# Patient Record
Sex: Female | Born: 1955
Health system: Southern US, Community
[De-identification: ages and names within clinical notes are randomized; demographics above are authoritative.]

## PROBLEM LIST (undated history)

## (undated) DIAGNOSIS — J45909 Unspecified asthma, uncomplicated: Secondary | ICD-10-CM

## (undated) DIAGNOSIS — K219 Gastro-esophageal reflux disease without esophagitis: Secondary | ICD-10-CM

## (undated) HISTORY — PX: KNEE SURGERY: SHX244

## (undated) HISTORY — PX: ABDOMINAL HYSTERECTOMY: SHX81

## (undated) HISTORY — PX: OTHER SURGICAL HISTORY: SHX169

## (undated) HISTORY — PX: WRIST ARTHROSCOPY WITH CARPOMETACARPEL (CMC) ARTHROPLASTY: SHX5680

## (undated) HISTORY — DX: Gastro-esophageal reflux disease without esophagitis: K21.9

---

## 1999-02-21 ENCOUNTER — Emergency Department (HOSPITAL_COMMUNITY): Admission: EM | Admit: 1999-02-21 | Discharge: 1999-02-21 | Payer: Self-pay | Admitting: Emergency Medicine

## 1999-02-21 ENCOUNTER — Encounter: Payer: Self-pay | Admitting: Emergency Medicine

## 1999-02-22 ENCOUNTER — Emergency Department (HOSPITAL_COMMUNITY): Admission: EM | Admit: 1999-02-22 | Discharge: 1999-02-22 | Payer: Self-pay | Admitting: Emergency Medicine

## 2000-06-22 ENCOUNTER — Ambulatory Visit (HOSPITAL_COMMUNITY): Admission: RE | Admit: 2000-06-22 | Discharge: 2000-06-22 | Payer: Self-pay | Admitting: Family Medicine

## 2000-06-22 ENCOUNTER — Encounter: Payer: Self-pay | Admitting: Family Medicine

## 2000-07-08 ENCOUNTER — Encounter: Admission: RE | Admit: 2000-07-08 | Discharge: 2000-07-08 | Payer: Self-pay | Admitting: Family Medicine

## 2000-07-08 ENCOUNTER — Encounter: Payer: Self-pay | Admitting: Family Medicine

## 2000-09-22 ENCOUNTER — Encounter (INDEPENDENT_AMBULATORY_CARE_PROVIDER_SITE_OTHER): Payer: Self-pay | Admitting: Specialist

## 2000-09-22 ENCOUNTER — Other Ambulatory Visit: Admission: RE | Admit: 2000-09-22 | Discharge: 2000-09-22 | Payer: Self-pay | Admitting: *Deleted

## 2001-08-05 ENCOUNTER — Encounter: Admission: RE | Admit: 2001-08-05 | Discharge: 2001-08-05 | Payer: Self-pay | Admitting: Family Medicine

## 2001-08-05 ENCOUNTER — Encounter: Payer: Self-pay | Admitting: Family Medicine

## 2001-08-08 ENCOUNTER — Encounter: Admission: RE | Admit: 2001-08-08 | Discharge: 2001-08-08 | Payer: Self-pay | Admitting: Family Medicine

## 2001-08-08 ENCOUNTER — Encounter: Payer: Self-pay | Admitting: Family Medicine

## 2002-10-11 ENCOUNTER — Encounter: Admission: RE | Admit: 2002-10-11 | Discharge: 2002-10-11 | Payer: Self-pay | Admitting: Family Medicine

## 2002-10-11 ENCOUNTER — Encounter: Payer: Self-pay | Admitting: Family Medicine

## 2004-01-08 ENCOUNTER — Other Ambulatory Visit: Admission: RE | Admit: 2004-01-08 | Discharge: 2004-01-08 | Payer: Self-pay | Admitting: Family Medicine

## 2004-01-14 ENCOUNTER — Encounter: Admission: RE | Admit: 2004-01-14 | Discharge: 2004-01-14 | Payer: Self-pay | Admitting: Family Medicine

## 2004-06-30 ENCOUNTER — Encounter: Admission: RE | Admit: 2004-06-30 | Discharge: 2004-06-30 | Payer: Self-pay | Admitting: Allergy and Immunology

## 2005-02-12 ENCOUNTER — Other Ambulatory Visit: Admission: RE | Admit: 2005-02-12 | Discharge: 2005-02-12 | Payer: Self-pay | Admitting: Family Medicine

## 2005-03-17 ENCOUNTER — Encounter: Admission: RE | Admit: 2005-03-17 | Discharge: 2005-03-17 | Payer: Self-pay | Admitting: Family Medicine

## 2006-03-25 ENCOUNTER — Other Ambulatory Visit: Admission: RE | Admit: 2006-03-25 | Discharge: 2006-03-25 | Payer: Self-pay | Admitting: Family Medicine

## 2006-06-04 ENCOUNTER — Encounter: Admission: RE | Admit: 2006-06-04 | Discharge: 2006-06-04 | Payer: Self-pay | Admitting: Family Medicine

## 2007-08-10 ENCOUNTER — Encounter: Admission: RE | Admit: 2007-08-10 | Discharge: 2007-08-10 | Payer: Self-pay | Admitting: Family Medicine

## 2008-08-29 ENCOUNTER — Encounter: Admission: RE | Admit: 2008-08-29 | Discharge: 2008-08-29 | Payer: Self-pay | Admitting: Hematology and Oncology

## 2009-09-25 ENCOUNTER — Encounter: Admission: RE | Admit: 2009-09-25 | Discharge: 2009-09-25 | Payer: Self-pay | Admitting: Hematology and Oncology

## 2010-01-03 ENCOUNTER — Encounter (INDEPENDENT_AMBULATORY_CARE_PROVIDER_SITE_OTHER): Payer: Self-pay | Admitting: *Deleted

## 2010-02-05 ENCOUNTER — Encounter (INDEPENDENT_AMBULATORY_CARE_PROVIDER_SITE_OTHER): Payer: Self-pay | Admitting: *Deleted

## 2010-02-06 ENCOUNTER — Ambulatory Visit: Payer: Self-pay | Admitting: Internal Medicine

## 2010-02-19 ENCOUNTER — Ambulatory Visit: Payer: Self-pay | Admitting: Internal Medicine

## 2010-02-21 ENCOUNTER — Encounter: Payer: Self-pay | Admitting: Internal Medicine

## 2010-10-13 ENCOUNTER — Encounter
Admission: RE | Admit: 2010-10-13 | Discharge: 2010-10-13 | Payer: Self-pay | Source: Home / Self Care | Attending: Obstetrics and Gynecology | Admitting: Obstetrics and Gynecology

## 2010-11-29 ENCOUNTER — Encounter: Payer: Self-pay | Admitting: Family Medicine

## 2010-12-01 ENCOUNTER — Encounter: Payer: Self-pay | Admitting: Family Medicine

## 2010-12-11 NOTE — Letter (Signed)
Summary: Patient Notice- Polyp Results  [facility]  [address] [place], [place] [address]   Phone: [phone]  Fax: [phone]        February 21, 2010 MRN: [id number]    CHANLEY MCENERY [address] [place], [place]  [address]    Dear Ms. Nancy Nordmann,  I am pleased to inform you that the colon polyp(s) removed during your recent colonoscopy was (were) found to be benign (no cancer detected) upon pathologic examination.  I recommend you have a repeat colonoscopy examination in 5 years to look for recurrent polyps, as having colon polyps increases your risk for having recurrent polyps or even colon cancer in the future.  Should you develop new or worsening symptoms of abdominal pain, bowel habit changes or bleeding from the rectum or bowels, please schedule an evaluation with either your primary care physician or with me.  Additional information/recommendations:  __ No further action with gastroenterology is needed at this time. Please      follow-up with your primary care physician for your other healthcare      needs.   Please call us if you are having persistent problems or have questions about your condition that have not been fully answered at this time.  Sincerely,  Hilarie Fredrickson MD  This letter has been electronically signed by your physician.  Appended Document: Patient Notice- Polyp Results letter mailed 4.18.11

## 2010-12-11 NOTE — Letter (Signed)
Summary: [facility] Instructions  [facility]  [address] [place], [place] [address]   Phone: [phone]  Fax: [phone]       Whitney Wallace    09/29/1956    MRN: [id number]        Procedure Day Dorna Bloom:  Whitney Wallace 02/19/2010     Arrival Time: 10:00 am      Procedure Time: 11:00 am     Location of Procedure:                    _x _  [facility] (4th Floor)                        PREPARATION FOR COLONOSCOPY WITH MOVIPREP   Starting 5 days prior to your procedure Friday 4/8 do not eat nuts, seeds, popcorn, corn, beans, peas,  salads, or any raw vegetables.  Do not take any fiber supplements (e.g. Metamucil, Citrucel, and Benefiber).  THE DAY BEFORE YOUR PROCEDURE         DATE: Tuesday 4/12  1.  Drink clear liquids the entire day-NO SOLID FOOD  2.  Do not drink anything colored red or purple.  Avoid juices with pulp.  No orange juice.  3.  Drink at least 64 oz. (8 glasses) of fluid/clear liquids during the day to prevent dehydration and help the prep work efficiently.  CLEAR LIQUIDS INCLUDE: Water Jello Ice Popsicles Tea (sugar ok, no milk/cream) Powdered fruit flavored drinks Coffee (sugar ok, no milk/cream) Gatorade Juice: apple, white grape, white cranberry  Lemonade Clear bullion, consomm, broth Carbonated beverages (any kind) Strained chicken noodle soup Hard Candy                             4.  In the morning, mix first dose of MoviPrep solution:    Empty 1 Pouch A and 1 Pouch B into the disposable container    Add lukewarm drinking water to the top line of the container. Mix to dissolve    Refrigerate (mixed solution should be used within 24 hrs)  5.  Begin drinking the prep at 5:00 p.m. The MoviPrep container is divided by 4 marks.   Every 15 minutes drink the solution down to the next mark (approximately 8 oz) until the full liter is complete.   6.  Follow completed prep with 16 oz of clear liquid of your choice  (Nothing red or purple).  Continue to drink clear liquids until bedtime.  7.  Before going to bed, mix second dose of MoviPrep solution:    Empty 1 Pouch A and 1 Pouch B into the disposable container    Add lukewarm drinking water to the top line of the container. Mix to dissolve    Refrigerate  THE DAY OF YOUR PROCEDURE      DATE:  Wednesday 4/13  Beginning at 6:00 a.m. (5 hours before procedure):         1. Every 15 minutes, drink the solution down to the next mark (approx 8 oz) until the full liter is complete.  2. Follow completed prep with 16 oz. of clear liquid of your choice.    3. You may drink clear liquids until 9:00 am  (2 HOURS BEFORE PROCEDURE).   MEDICATION INSTRUCTIONS  Unless otherwise instructed, you should take regular prescription medications with a small sip of water   as early as possible the  morning of your procedure.        OTHER INSTRUCTIONS  You will need a responsible adult at least 53 years of age to accompany you and drive you home.   This person must remain in the waiting room during your procedure.  Wear loose fitting clothing that is easily removed.  Leave jewelry and other valuables at home.  However, you may wish to bring a book to read or  an iPod/MP3 player to listen to music as you wait for your procedure to start.  Remove all body piercing jewelry and leave at home.  Total time from sign-in until discharge is approximately 2-3 hours.  You should go home directly after your procedure and rest.  You can resume normal activities the  day after your procedure.  The day of your procedure you should not:   Drive   Make legal decisions   Operate machinery   Drink alcohol   Return to work  You will receive specific instructions about eating, activities and medications before you leave.    The above instructions have been reviewed and explained to me by   Karl Bales RN  February 06, 2010 2:46 PM     I fully understand  and can verbalize these instructions _____________________________ Date _________

## 2010-12-11 NOTE — Letter (Signed)
Summary: Previsit letter  [facility]  [address] [place], [place] [address]   Phone: [phone]  Fax: [phone]       01/03/2010 MRN: [id number]  Whitney Wallace [address] [place], [place]  [address]  Dear Ms. Nancy Nordmann,  Welcome to the Gastroenterology Division at [facility].    You are scheduled to see a nurse for your pre-procedure visit on 02-06-10 at 2:00p.m. on the 3rd floor at [facility], [address]  We ask that you try to arrive at our office 15 minutes prior to your appointment time to allow for check-in.  Your nurse visit will consist of discussing your medical and surgical history, your immediate family medical history, and your medications.    Please bring a complete list of all your medications or, if you prefer, bring the medication bottles and we will list them.  We will need to be aware of both prescribed and over the counter drugs.  We will need to know exact dosage information as well.  If you are on blood thinners (Coumadin, Plavix, Aggrenox, Ticlid, etc.) please call our office today/prior to your appointment, as we need to consult with your physician about holding your medication.   Please be prepared to read and sign documents such as consent forms, a financial agreement, and acknowledgement forms.  If necessary, and with your consent, a friend or relative is welcome to sit-in on the nurse visit with you.  Please bring your insurance card so that we may make a copy of it.  If your insurance requires a referral to see a specialist, please bring your referral form from your primary care physician.  No co-pay is required for this nurse visit.     If you cannot keep your appointment, please call [phone] to cancel or reschedule prior to your appointment date.  This allows [place] the opportunity to schedule an appointment for another patient in need of care.    Thank you for choosing [facility] for your medical  needs.  We appreciate the opportunity to care for you.  Please visit [place] at our website  to learn more about our practice.                     Sincerely.                                                                                                                   The Gastroenterology Division

## 2010-12-11 NOTE — Procedures (Signed)
Summary: Colonoscopy  Patient: [name] Note: All result statuses are Final unless otherwise noted.  Tests: (1) Colonoscopy (COL)   COL Colonoscopy           DONE      [facility]     [address]     [place], [place]  [address]           COLONOSCOPY PROCEDURE REPORT           PATIENT:  Whitney Wallace, Whitney Wallace  MR#:  [id number]     BIRTHDATE:  [birth year], 54 yrs. old  GENDER:  female     ENDOSCOPIST:  Wilhemina Bonito. Eda Keys, MD     REF. BY:  .Direct Self,     PROCEDURE DATE:  02/19/2010     PROCEDURE:  Colonoscopy with snare polypectomy     ASA CLASS:  Class III     INDICATIONS:  Elevated Risk Screening ; FATHER W/ COLON CANCER     AROUND 65; PT W/ PERSONAL HX UTERINE CA S/P SURGERY / CHEMO/XRT     MEDICATIONS:   Fentanyl 100 mcg IV, Versed 13 mg IV, Benadryl 50     mg IV           DESCRIPTION OF PROCEDURE:   After the risks benefits and     alternatives of the procedure were thoroughly explained, informed     consent was obtained.  Digital rectal exam was performed and     revealed no abnormalities.   The LB CF-[id number] endoscope     was introduced through the anus and advanced to the cecum, which     was identified by both the appendix and ileocecal valve, without     limitations.TIME TO CECUM =2:42 MIN. The quality of the prep was     excellent, using MoviPrep.  The instrument was then slowly     withdrawn (TIME = 13:03 MIN) as the colon was fully examined.     <<PROCEDUREIMAGES>>           FINDINGS:  A 6mm pedunculated polyp was found at the hepatic     flexure. Polyp was snared without cautery. Retrieval was     successful.  Mild diverticulosis was found in the sigmoid colon.     Melanosis coli  was found.    Unable to retroflex.    The scope was     then withdrawn from the patient and the procedure completed.           COMPLICATIONS:  None     ENDOSCOPIC IMPRESSION:     1) Pedunculated polyp at the hepatic flexure -removed     2) Mild diverticulosis in the  sigmoid colon     3) Melanosis           RECOMMENDATIONS:     1) Follow up colonoscopy in 5 years           ______________________________     Wilhemina Bonito. Eda Keys, MD           CC:  The Patient           n.     eSIGNED:   Wilhemina Bonito. Eda Keys at 02/19/2010 12:07 PM           Marene Lenz, [id number]  Note: An exclamation mark (!) indicates a result that was not dispersed into the flowsheet. Document Creation Date: 02/19/2010 12:07 PM _______________________________________________________________________  (1) Order result status: Final Collection or observation date-time: 02/19/2010 12:00  Requested date-time:  Receipt date-time:  Reported date-time:  Referring Physician:   Ordering Physician: Fransico Setters [phone]) Specimen Source:  Source: Launa Grill Order Number: [id number] Lab site:   Appended Document: Colonoscopy     Procedures Next Due Date:    Colonoscopy: 02/2015

## 2010-12-11 NOTE — Miscellaneous (Signed)
Summary: [facility] previsit  Clinical Lists Changes  Medications: Added new medication of MOVIPREP 100 GM  SOLR (PEG-KCL-NACL-NASULF-NA ASC-C) As per prep instructions. - Signed Rx of MOVIPREP 100 GM  SOLR (PEG-KCL-NACL-NASULF-NA ASC-C) As per prep instructions.;  #1 x 0;  Signed;  Entered by: Karl Bales RN;  Authorized by: Hilarie Fredrickson MD;  Method used: Electronically to [facility] [address] [place], [place], [place]  [address], Ph: [phone], Fax: [phone] Allergies: Added new allergy or adverse reaction of FLEXERIL Added new allergy or adverse reaction of LEVAQUIN Observations: Added new observation of NKA: F (02/06/2010 13:46)    Prescriptions: MOVIPREP 100 GM  SOLR (PEG-KCL-NACL-NASULF-NA ASC-C) As per prep instructions.  #1 x 0   Entered by:   Karl Bales RN   Authorized by:   Hilarie Fredrickson MD   Signed by:   Karl Bales RN on 02/06/2010   Method used:   Electronically to        [facility] (retail)       [address]       [place], [place]  [address]       Ph: [phone]       Fax: [phone]   RxID:   [id number]

## 2011-05-26 ENCOUNTER — Telehealth: Payer: Self-pay | Admitting: Gastroenterology

## 2011-05-26 ENCOUNTER — Telehealth: Payer: Self-pay | Admitting: Internal Medicine

## 2011-05-26 NOTE — Telephone Encounter (Signed)
Previous note had wrong MD on it.

## 2011-05-26 NOTE — Telephone Encounter (Signed)
Transferring GI Care

## 2011-12-18 ENCOUNTER — Other Ambulatory Visit: Payer: Self-pay | Admitting: Family Medicine

## 2011-12-18 DIAGNOSIS — Z1231 Encounter for screening mammogram for malignant neoplasm of breast: Secondary | ICD-10-CM

## 2012-01-06 ENCOUNTER — Ambulatory Visit
Admission: RE | Admit: 2012-01-06 | Discharge: 2012-01-06 | Disposition: A | Discharge status: Home / Self Care | Payer: Self-pay | Source: Ambulatory Visit | Attending: Family Medicine | Admitting: Family Medicine

## 2012-01-06 DIAGNOSIS — Z1231 Encounter for screening mammogram for malignant neoplasm of breast: Secondary | ICD-10-CM

## 2012-12-15 ENCOUNTER — Other Ambulatory Visit: Payer: Self-pay | Admitting: Family Medicine

## 2012-12-15 DIAGNOSIS — Z1231 Encounter for screening mammogram for malignant neoplasm of breast: Secondary | ICD-10-CM

## 2013-01-12 ENCOUNTER — Ambulatory Visit
Admission: RE | Admit: 2013-01-12 | Discharge: 2013-01-12 | Disposition: A | Discharge status: Home / Self Care | Payer: Medicare Other | Source: Ambulatory Visit | Attending: Family Medicine | Admitting: Family Medicine

## 2013-01-12 ENCOUNTER — Ambulatory Visit: Payer: Self-pay

## 2014-01-05 ENCOUNTER — Emergency Department (HOSPITAL_COMMUNITY): Payer: Medicare Other

## 2014-01-05 ENCOUNTER — Emergency Department (HOSPITAL_COMMUNITY)
Admission: EM | Admit: 2014-01-05 | Discharge: 2014-01-05 | Disposition: A | Discharge status: Nursing Home (Includes ICF) | Payer: Medicare Other | Source: Home / Self Care | Attending: Emergency Medicine | Admitting: Emergency Medicine

## 2014-01-05 ENCOUNTER — Encounter (HOSPITAL_COMMUNITY): Payer: Self-pay | Admitting: Emergency Medicine

## 2014-01-05 ENCOUNTER — Emergency Department (HOSPITAL_COMMUNITY)
Admission: EM | Admit: 2014-01-05 | Discharge: 2014-01-05 | Disposition: A | Discharge status: Home / Self Care | Payer: Medicare Other | Attending: Emergency Medicine | Admitting: Emergency Medicine

## 2014-01-05 DIAGNOSIS — Z792 Long term (current) use of antibiotics: Secondary | ICD-10-CM | POA: Insufficient documentation

## 2014-01-05 DIAGNOSIS — Z8544 Personal history of malignant neoplasm of other female genital organs: Secondary | ICD-10-CM | POA: Insufficient documentation

## 2014-01-05 DIAGNOSIS — R55 Syncope and collapse: Secondary | ICD-10-CM

## 2014-01-05 DIAGNOSIS — Y9289 Other specified places as the place of occurrence of the external cause: Secondary | ICD-10-CM | POA: Insufficient documentation

## 2014-01-05 DIAGNOSIS — S0083XA Contusion of other part of head, initial encounter: Secondary | ICD-10-CM

## 2014-01-05 DIAGNOSIS — S1093XA Contusion of unspecified part of neck, initial encounter: Secondary | ICD-10-CM

## 2014-01-05 DIAGNOSIS — S0993XA Unspecified injury of face, initial encounter: Secondary | ICD-10-CM

## 2014-01-05 DIAGNOSIS — K219 Gastro-esophageal reflux disease without esophagitis: Secondary | ICD-10-CM | POA: Insufficient documentation

## 2014-01-05 DIAGNOSIS — Y9301 Activity, walking, marching and hiking: Secondary | ICD-10-CM | POA: Insufficient documentation

## 2014-01-05 DIAGNOSIS — S199XXA Unspecified injury of neck, initial encounter: Secondary | ICD-10-CM

## 2014-01-05 DIAGNOSIS — Z79899 Other long term (current) drug therapy: Secondary | ICD-10-CM | POA: Insufficient documentation

## 2014-01-05 DIAGNOSIS — R296 Repeated falls: Secondary | ICD-10-CM | POA: Insufficient documentation

## 2014-01-05 DIAGNOSIS — S0003XA Contusion of scalp, initial encounter: Secondary | ICD-10-CM | POA: Insufficient documentation

## 2014-01-05 DIAGNOSIS — IMO0002 Reserved for concepts with insufficient information to code with codable children: Secondary | ICD-10-CM | POA: Insufficient documentation

## 2014-01-05 DIAGNOSIS — J45909 Unspecified asthma, uncomplicated: Secondary | ICD-10-CM | POA: Insufficient documentation

## 2014-01-05 HISTORY — DX: Unspecified asthma, uncomplicated: J45.909

## 2014-01-05 HISTORY — DX: Gastro-esophageal reflux disease without esophagitis: K21.9

## 2014-01-05 LAB — CBC
HEMATOCRIT: 37.8 % (ref 36.0–46.0)
HEMOGLOBIN: 12.4 g/dL (ref 12.0–15.0)
MCH: 31.4 pg (ref 26.0–34.0)
MCHC: 32.8 g/dL (ref 30.0–36.0)
MCV: 95.7 fL (ref 78.0–100.0)
Platelets: 409 10*3/uL — ABNORMAL HIGH (ref 150–400)
RBC: 3.95 MIL/uL (ref 3.87–5.11)
RDW: 15.1 % (ref 11.5–15.5)
WBC: 7.2 10*3/uL (ref 4.0–10.5)

## 2014-01-05 LAB — I-STAT TROPONIN, ED: TROPONIN I, POC: 0.01 ng/mL (ref 0.00–0.08)

## 2014-01-05 LAB — BASIC METABOLIC PANEL
BUN: 9 mg/dL (ref 6–23)
CHLORIDE: 103 meq/L (ref 96–112)
CO2: 26 meq/L (ref 19–32)
Calcium: 9.7 mg/dL (ref 8.4–10.5)
Creatinine, Ser: 0.9 mg/dL (ref 0.50–1.10)
GFR calc non Af Amer: 70 mL/min — ABNORMAL LOW (ref >90)
GFR, EST AFRICAN AMERICAN: 81 mL/min — AB (ref >90)
Glucose, Bld: 96 mg/dL (ref 70–99)
POTASSIUM: 4 meq/L (ref 3.7–5.3)
SODIUM: 142 meq/L (ref 137–147)

## 2014-01-05 LAB — I-STAT CHEM 8, ED
BUN: 8 mg/dL (ref 6–23)
CALCIUM ION: 1.26 mmol/L — AB (ref 1.12–1.23)
CREATININE: 1 mg/dL (ref 0.50–1.10)
Chloride: 102 mEq/L (ref 96–112)
Glucose, Bld: 97 mg/dL (ref 70–99)
HEMATOCRIT: 40 % (ref 36.0–46.0)
HEMOGLOBIN: 13.6 g/dL (ref 12.0–15.0)
Potassium: 3.9 mEq/L (ref 3.7–5.3)
SODIUM: 142 meq/L (ref 137–147)
TCO2: 27 mmol/L (ref 0–100)

## 2014-01-05 LAB — CBG MONITORING, ED: GLUCOSE-CAPILLARY: 93 mg/dL (ref 70–99)

## 2014-01-05 MED ORDER — HYDROCODONE-ACETAMINOPHEN 5-325 MG PO TABS: 2.0000 | ORAL_TABLET | ORAL | Status: DC | PRN

## 2014-01-05 MED ORDER — FLUTICASONE PROPIONATE 50 MCG/ACT NA SUSP: NASAL | Status: DC

## 2014-01-05 MED ORDER — ONDANSETRON HCL 4 MG/2ML IJ SOLN
4.0000 mg | Freq: Once | INTRAMUSCULAR | Status: AC
  Administered 2014-01-05: 4 mg via INTRAVENOUS
  Filled 2014-01-05: qty 2

## 2014-01-05 MED ORDER — MORPHINE SULFATE 4 MG/ML IJ SOLN
4.0000 mg | INTRAMUSCULAR | Status: DC | PRN
  Administered 2014-01-05: 4 mg via INTRAVENOUS
  Filled 2014-01-05: qty 1

## 2014-01-05 NOTE — ED Notes (Addendum)
Presents with Syncopal episode occurred at 06:15 this AM. Pt reports, "I just woke up and was walking down a hallway and decided I needed to stretch. I raised my hands up over my head and began to stretch and the next thing I knew I woke up on the floor " denies pain, denies dizziness. Hematoma to right side of face. Reports some mild dizzy spells at times but nothing right before this happened. Alert, and oreinted. Answering all questions appropriately. Reports facial pain and jaw pain denies vision difficulty from right eye. dneis chest pain, nausea, weakness.  Endorses neck pain. c-collar applied

## 2014-01-05 NOTE — Discharge Instructions (Signed)
Facial or Scalp Contusion A facial or scalp contusion is a deep bruise on the face or head. Injuries to the face and head generally cause a lot of swelling, especially around the eyes. Contusions are the result of an injury that caused bleeding under the skin. The contusion may turn blue, purple, or yellow. Minor injuries will give you a painless contusion, but more severe contusions may stay painful and swollen for a few weeks.  CAUSES  A facial or scalp contusion is caused by a blunt injury or trauma to the face or head area.  SIGNS AND SYMPTOMS   Swelling of the injured area.   Discoloration of the injured area.   Tenderness, soreness, or pain in the injured area.  DIAGNOSIS  The diagnosis can be made by taking a medical history and doing a physical exam. An X-ray exam, CT scan, or MRI may be needed to determine if there are any associated injuries, such as broken bones (fractures). TREATMENT  Often, the best treatment for a facial or scalp contusion is applying cold compresses to the injured area. Over-the-counter medicines may also be recommended for pain control.  HOME CARE INSTRUCTIONS   Only take over-the-counter or prescription medicines as directed by your health care provider.   Apply ice to the injured area.   Put ice in a plastic bag.   Place a towel between your skin and the bag.   Leave the ice on for 20 minutes, 2 3 times a day.  SEEK MEDICAL CARE IF:  You have bite problems.   You have pain with chewing.   You are concerned about facial defects. SEEK IMMEDIATE MEDICAL CARE IF:  You have severe pain or a headache that is not relieved by medicine.   You have unusual sleepiness, confusion, or personality changes.   You throw up (vomit).   You have a persistent nosebleed.   You have double vision or blurred vision.   You have fluid drainage from your nose or ear.   You have difficulty walking or using your arms or legs.  MAKE SURE YOU:    Understand these instructions.  Will watch your condition.  Will get help right away if you are not doing well or get worse. Document Released: 12/03/2004 Document Revised: 08/16/2013 Document Reviewed: 06/08/2013 [facility] Patient Information 2014 [place], [place].  Vasovagal Syncope, Adult Syncope, commonly known as fainting, is a temporary loss of consciousness. It occurs when the blood flow to the brain is reduced. Vasovagal syncope (also called neurocardiogenic syncope) is a fainting spell in which the blood flow to the brain is reduced because of a sudden drop in heart rate and blood pressure. Vasovagal syncope occurs when the brain and the cardiovascular system (blood vessels) do not adequately communicate and respond to each other. This is the most common cause of fainting. It often occurs in response to fear or some other type of emotional or physical stress. The body has a reaction in which the heart starts beating too slowly or the blood vessels expand, reducing blood pressure. This type of fainting spell is generally considered harmless. However, injuries can occur if a person takes a sudden fall during a fainting spell.  CAUSES  Vasovagal syncope occurs when a person's blood pressure and heart rate decrease suddenly, usually in response to a trigger. Many things and situations can trigger an episode. Some of these include:   Pain.   Fear.   The sight of blood or medical procedures, such as blood being  drawn from a vein.   Common activities, such as coughing, swallowing, stretching, or going to the bathroom.   Emotional stress.   Prolonged standing, especially in a warm environment.   Lack of sleep or rest.   Prolonged lack of food.   Prolonged lack of fluids.   Recent illness.  The use of certain drugs that affect blood pressure, such as cocaine, alcohol, marijuana, inhalants, and opiates.  SYMPTOMS  Before the fainting episode, you may:   Feel dizzy or  light headed.   Become pale.  Sense that you are going to faint.   Feel like the room is spinning.   Have tunnel vision, only seeing directly in front of you.   Feel sick to your stomach (nauseous).   See spots or slowly lose vision.   Hear ringing in your ears.   Have a headache.   Feel warm and sweaty.   Feel a sensation of pins and needles. During the fainting spell, you will generally be unconscious for no longer than a couple minutes before waking up and returning to normal. If you get up too quickly before your body can recover, you may faint again. Some twitching or jerky movements may occur during the fainting spell.  DIAGNOSIS  Your caregiver will ask about your symptoms, take a medical history, and perform a physical exam. Various tests may be done to rule out other causes of fainting. These may include blood tests and tests to check the heart, such as electrocardiography, echocardiography, and possibly an electrophysiology study. When other causes have been ruled out, a test may be done to check the body's response to changes in position (tilt table test). TREATMENT  Most cases of vasovagal syncope do not require treatment. Your caregiver may recommend ways to avoid fainting triggers and may provide home strategies for preventing fainting. If you must be exposed to a possible trigger, you can drink additional fluids to help reduce your chances of having an episode of vasovagal syncope. If you have warning signs of an oncoming episode, you can respond by positioning yourself favorably (lying down). If your fainting spells continue, you may be given medicines to prevent fainting. Some medicines may help make you more resistant to repeated episodes of vasovagal syncope. Special exercises or compression stockings may be recommended. In rare cases, the surgical placement of a pacemaker is considered. HOME CARE INSTRUCTIONS   Learn to identify the warning signs of vasovagal  syncope.   Sit or lie down at the first warning sign of a fainting spell. If sitting, put your head down between your legs. If you lie down, swing your legs up in the air to increase blood flow to the brain.   Avoid hot tubs and saunas.  Avoid prolonged standing.  Drink enough fluids to keep your urine clear or pale yellow. Avoid caffeine.  Increase salt in your diet as directed by your caregiver.   If you have to stand for a long time, perform movements such as:   Crossing your legs.   Flexing and stretching your leg muscles.   Squatting.   Moving your legs.   Bending over.   Only take over-the-counter or prescription medicines as directed by your caregiver. Do not suddenly stop any medicines without asking your caregiver first. SEEK MEDICAL CARE IF:   Your fainting spells continue or happen more frequently in spite of treatment.   You lose consciousness for more than a couple minutes.  You have fainting spells during or after  exercising or after being startled.   You have new symptoms that occur with the fainting spells, such as:   Shortness of breath.  Chest pain.   Irregular heartbeat.   You have episodes of twitching or jerky movements that last longer than a few seconds.  You have episodes of twitching or jerky movements without obvious fainting. SEEK IMMEDIATE MEDICAL CARE IF:   You have injuries or bleeding after a fainting spell.   You have episodes of twitching or jerky movements that last longer than 5 minutes.   You have more than one spell of twitching or jerky movements before returning to consciousness after fainting. MAKE SURE YOU:   Understand these instructions.  Will watch your condition.  Will get help right away if you are not doing well or get worse. Document Released: 10/12/2012 Document Reviewed: 10/12/2012 [facility] Patient Information 2014 [place], [place].

## 2014-01-05 NOTE — ED Provider Notes (Signed)
CSN: [id number]     Arrival date & time 01/05/14  1554 History   First MD Initiated Contact with Patient 01/05/14 1733     Chief Complaint  Patient presents with  . Loss of Consciousness      HPI  Patient presents after a syncopal episode. Seen initially at urgent care and referred here. Patient states he awakened early this morning. She stood up and started walking the hall she stretched arms over her head. She felt dizzy for one brief seconds and then had a fall the floor. She was not unconscious. She states she felt dizzy and took a few seconds to gather herself. She has pain and swelling around her right eye. Her husband heard the fall and he states one second later she called out to him. He went to her. She was awake. Complaining of pain around her eye. She was dizzy described as lightheaded but not vertigo. He took her to the restroom and back to bed. Pain around her eye progressed, so they went to urgent care and was referred here. She states she "always" gets dizzy when she stands up. She sits for a bit. She did not today. No chest pain. No palpitations. No fluid loss with vomiting or diarrhea.  Past Medical History  Diagnosis Date  . Asthma   . GERD (gastroesophageal reflux disease)    Past Surgical History  Procedure Laterality Date  . Uterine cancer    . Abdominal hysterectomy    . Knee surgery     History reviewed. No pertinent family history. History  Substance Use Topics  . Smoking status: Never Smoker   . Smokeless tobacco: Not on file  . Alcohol Use: Yes   OB History   Grav Para Term Preterm Abortions TAB SAB Ect Mult Living                 Review of Systems  Constitutional: Negative for fever, chills, diaphoresis, appetite change and fatigue.  HENT: Negative for mouth sores, sore throat and trouble swallowing.        Pain and swelling around the right eye. No vision changes or  Eyes: Negative for visual disturbance.  Respiratory: Negative for cough, chest  tightness, shortness of breath and wheezing.   Cardiovascular: Negative for chest pain.  Gastrointestinal: Negative for nausea, vomiting, abdominal pain, diarrhea and abdominal distention.  Endocrine: Negative for polydipsia, polyphagia and polyuria.  Genitourinary: Negative for dysuria, frequency and hematuria.  Musculoskeletal: Negative for gait problem.  Skin: Negative for color change, pallor and rash.  Neurological: Positive for syncope. Negative for dizziness, light-headedness and headaches.  Hematological: Does not bruise/bleed easily.  Psychiatric/Behavioral: Negative for behavioral problems and confusion.      Allergies  Cyclobenzaprine hcl  Home Medications   Current Outpatient Rx  Name  Route  Sig  Dispense  Refill  . acetaminophen (TYLENOL) 500 MG tablet   Oral   Take 1,000 mg by mouth every 6 (six) hours as needed for moderate pain.         Marland Kitchen albuterol (PROVENTIL HFA;VENTOLIN HFA) 108 (90 BASE) MCG/ACT inhaler   Inhalation   Inhale 2 puffs into the lungs every 6 (six) hours as needed for wheezing or shortness of breath.         . ALPRAZolam (XANAX) 1 MG tablet   Oral   Take 1 mg by mouth 2 (two) times daily.         . Ascorbic Acid (VITAMIN C) 1000 MG tablet  Oral   Take 1,000 mg by mouth 2 (two) times daily.         . budesonide-formoterol (SYMBICORT) 80-4.5 MCG/ACT inhaler   Inhalation   Inhale 2 puffs into the lungs 2 (two) times daily.         Marland Kitchen buPROPion (WELLBUTRIN XL) 150 MG 24 hr tablet   Oral   Take 150 mg by mouth daily.         Marland Kitchen CALCIUM CITRATE PO   Oral   Take 1 tablet by mouth 2 (two) times daily.         . Cholecalciferol (VITAMIN D PO)   Oral   Take 1 tablet by mouth daily.         Marland Kitchen dexlansoprazole (DEXILANT) 60 MG capsule   Oral   Take 60 mg by mouth daily.         . furosemide (LASIX) 20 MG tablet   Oral   Take 20 mg by mouth daily as needed for fluid or edema.         . gabapentin (NEURONTIN) 300 MG  capsule   Oral   Take 300 mg by mouth at bedtime.         Marland Kitchen levothyroxine (SYNTHROID, LEVOTHROID) 88 MCG tablet   Oral   Take 88 mcg by mouth daily before breakfast.         . MAGNESIUM PO   Oral   Take 1 tablet by mouth daily. Also contains vitamin K         . metronidazole (NORITATE) 1 % cream   Topical   Apply 1 application topically daily.         . montelukast (SINGULAIR) 10 MG tablet   Oral   Take 10 mg by mouth at bedtime.         . Multiple Vitamin (MULTIVITAMIN WITH MINERALS) TABS tablet   Oral   Take 1 tablet by mouth daily.         . Multiple Vitamins-Minerals (ZINC PO)   Oral   Take 1 tablet by mouth daily.         . potassium chloride (K-DUR,KLOR-CON) 10 MEQ tablet   Oral   Take 10 mEq by mouth daily.         . Probiotic Product (PROBIOTIC DAILY PO)   Oral   Take 1 tablet by mouth daily.          . sertraline (ZOLOFT) 100 MG tablet   Oral   Take 100 mg by mouth daily.         . Teriparatide, Recombinant, (FORTEO [place])   Subcutaneous   Inject 1 each into the skin at bedtime.         Marland Kitchen tetrahydrozoline 0.05 % ophthalmic solution   Both Eyes   Place 2 drops into both eyes daily as needed (for dry eyes).         . Triamcinolone Acetonide (NASACORT ALLERGY 24HR NA)   Nasal   Place 2 sprays into the nose at bedtime.          . vitamin C (ASCORBIC ACID) 500 MG tablet   Oral   Take 500 mg by mouth daily.         Marland Kitchen zolpidem (AMBIEN) 10 MG tablet   Oral   Take 10 mg by mouth at bedtime as needed for sleep.         . fluticasone (FLONASE) 50 MCG/ACT nasal spray      1 spray  each nares BID   10 g   1   . HYDROcodone-acetaminophen (NORCO/VICODIN) 5-325 MG per tablet   Oral   Take 2 tablets by mouth every 4 (four) hours as needed.   20 tablet   0    BP 133/66  Pulse 70  Temp(Src) 97.8 F (36.6 C) (Oral)  Resp 17  Ht 5\' 1"  (1.549 m)  Wt 150 lb (68.04 kg)  BMI 28.36 kg/m2  SpO2 97% Physical Exam   Constitutional: She is oriented to person, place, and time. She appears well-developed and well-nourished. No distress.  HENT:  Head: Normocephalic.  Circumferential right periorbital ecchymosis. No proptosis, or enophthalmos. Normal extraocular movements without entrapment. No subjective diplopia. Normal V1 through V3 distribution sensation in the face. No blood over the TMs, or from ears nose or mouth.  Eyes: Conjunctivae are normal. Pupils are equal, round, and reactive to light. No scleral icterus.  Neck: Normal range of motion. Neck supple. No thyromegaly present.  Cardiovascular: Normal rate and regular rhythm.  Exam reveals no gallop and no friction rub.   No murmur heard. Pulmonary/Chest: Effort normal and breath sounds normal. No respiratory distress. She has no wheezes. She has no rales.  Abdominal: Soft. Bowel sounds are normal. She exhibits no distension. There is no tenderness. There is no rebound.  Musculoskeletal: Normal range of motion.  Neurological: She is alert and oriented to person, place, and time.  Skin: Skin is warm and dry. No rash noted.  Psychiatric: She has a normal mood and affect. Her behavior is normal.    ED Course  Procedures (including critical care time) Labs Review Labs Reviewed  CBC - Abnormal; Notable for the following:    Platelets 409 (*)    All other components within normal limits  BASIC METABOLIC PANEL - Abnormal; Notable for the following:    GFR calc non Af Amer 70 (*)    GFR calc Af Amer 81 (*)    All other components within normal limits  I-STAT CHEM 8, ED - Abnormal; Notable for the following:    Calcium, Ion 1.26 (*)    All other components within normal limits  CBG MONITORING, ED  I-STAT TROPOININ, ED   Imaging Review Ct Head Wo Contrast  01/05/2014   CLINICAL DATA:  Loss of consciousness  EXAM: CT HEAD WITHOUT CONTRAST  CT MAXILLOFACIAL WITHOUT CONTRAST  CT CERVICAL SPINE WITHOUT CONTRAST  TECHNIQUE: Multidetector CT imaging of  the head, cervical spine, and maxillofacial structures were performed using the standard protocol without intravenous contrast. Multiplanar CT image reconstructions of the cervical spine and maxillofacial structures were also generated.  COMPARISON:  None.  FINDINGS: CT HEAD FINDINGS  No skull fracture is noted. Extensive paranasal sinuses disease. The mastoid air cells are unremarkable.  No intracranial hemorrhage, mass effect or midline shift. No hydrocephalus. The gray and white-matter differentiation is preserved.  No acute infarction. No mass lesion is noted on this unenhanced scan.  CT MAXILLOFACIAL FINDINGS  Axial images shows no facial fractures. There is significant mucosal thickening bilateral frontal sinus. Mucosal thickening with almost complete opacification bilateral ethmoid air cells. There is complete opacification of the right sphenoid sinus. Almost complete opacification of the left sphenoid sinus. Concentric mucosal thickening bilateral maxillary sinus.  No zygomatic fracture is noted. No nasal bone fracture. There is soft tissue swelling in right lateral orbital region and right zygomatic region. There is mild subcutaneous stranding. This is best seen in axial image 12 and coronal image 35.  Coronal images shows no intraorbital hematoma. No orbital rim or orbital floor fracture. There is nasal mucosal thickening bilaterally especially superior aspect superior turbinates.  No facial fluid collection is noted. No mandibular fracture. No TMJ dislocation. Bilateral eye globe is symmetrical in appearance. Sagittal images shows patent nasopharyngeal and oropharyngeal airway.  CT CERVICAL SPINE FINDINGS  Axial images of the cervical spine shows no acute fracture or subluxation. Computer processed images shows no acute fracture or subluxation. No prevertebral soft tissue swelling. Cervical airway is patent. There is no pneumothorax in visualized lung apices.  Coronal images shows no acute fracture or  subluxation.  IMPRESSION: 1. No acute intracranial abnormality. Extensive paranasal sinuses disease. 2. No facial fractures are noted. There is soft tissue swelling right periorbital and right lateral periorbital/zygomatic region. Mild subcutaneous stranding. No intraorbital hematoma. 3. No orbital rim or orbital floor fracture. 4. There is extensive paranasal sinuses disease as described above. 5. No cervical spine acute fracture or subluxation. 6. No zygomatic fracture.   Electronically Signed   By: Lahoma Crocker M.D.   On: 01/05/2014 21:04   Ct Cervical Spine Wo Contrast  01/05/2014   CLINICAL DATA:  Loss of consciousness  EXAM: CT HEAD WITHOUT CONTRAST  CT MAXILLOFACIAL WITHOUT CONTRAST  CT CERVICAL SPINE WITHOUT CONTRAST  TECHNIQUE: Multidetector CT imaging of the head, cervical spine, and maxillofacial structures were performed using the standard protocol without intravenous contrast. Multiplanar CT image reconstructions of the cervical spine and maxillofacial structures were also generated.  COMPARISON:  None.  FINDINGS: CT HEAD FINDINGS  No skull fracture is noted. Extensive paranasal sinuses disease. The mastoid air cells are unremarkable.  No intracranial hemorrhage, mass effect or midline shift. No hydrocephalus. The gray and white-matter differentiation is preserved.  No acute infarction. No mass lesion is noted on this unenhanced scan.  CT MAXILLOFACIAL FINDINGS  Axial images shows no facial fractures. There is significant mucosal thickening bilateral frontal sinus. Mucosal thickening with almost complete opacification bilateral ethmoid air cells. There is complete opacification of the right sphenoid sinus. Almost complete opacification of the left sphenoid sinus. Concentric mucosal thickening bilateral maxillary sinus.  No zygomatic fracture is noted. No nasal bone fracture. There is soft tissue swelling in right lateral orbital region and right zygomatic region. There is mild subcutaneous stranding.  This is best seen in axial image 12 and coronal image 35.  Coronal images shows no intraorbital hematoma. No orbital rim or orbital floor fracture. There is nasal mucosal thickening bilaterally especially superior aspect superior turbinates.  No facial fluid collection is noted. No mandibular fracture. No TMJ dislocation. Bilateral eye globe is symmetrical in appearance. Sagittal images shows patent nasopharyngeal and oropharyngeal airway.  CT CERVICAL SPINE FINDINGS  Axial images of the cervical spine shows no acute fracture or subluxation. Computer processed images shows no acute fracture or subluxation. No prevertebral soft tissue swelling. Cervical airway is patent. There is no pneumothorax in visualized lung apices.  Coronal images shows no acute fracture or subluxation.  IMPRESSION: 1. No acute intracranial abnormality. Extensive paranasal sinuses disease. 2. No facial fractures are noted. There is soft tissue swelling right periorbital and right lateral periorbital/zygomatic region. Mild subcutaneous stranding. No intraorbital hematoma. 3. No orbital rim or orbital floor fracture. 4. There is extensive paranasal sinuses disease as described above. 5. No cervical spine acute fracture or subluxation. 6. No zygomatic fracture.   Electronically Signed   By: Lahoma Crocker M.D.   On: 01/05/2014 21:04   Ct Maxillofacial Wo Cm  01/05/2014   CLINICAL DATA:  Loss of consciousness  EXAM: CT HEAD WITHOUT CONTRAST  CT MAXILLOFACIAL WITHOUT CONTRAST  CT CERVICAL SPINE WITHOUT CONTRAST  TECHNIQUE: Multidetector CT imaging of the head, cervical spine, and maxillofacial structures were performed using the standard protocol without intravenous contrast. Multiplanar CT image reconstructions of the cervical spine and maxillofacial structures were also generated.  COMPARISON:  None.  FINDINGS: CT HEAD FINDINGS  No skull fracture is noted. Extensive paranasal sinuses disease. The mastoid air cells are unremarkable.  No  intracranial hemorrhage, mass effect or midline shift. No hydrocephalus. The gray and white-matter differentiation is preserved.  No acute infarction. No mass lesion is noted on this unenhanced scan.  CT MAXILLOFACIAL FINDINGS  Axial images shows no facial fractures. There is significant mucosal thickening bilateral frontal sinus. Mucosal thickening with almost complete opacification bilateral ethmoid air cells. There is complete opacification of the right sphenoid sinus. Almost complete opacification of the left sphenoid sinus. Concentric mucosal thickening bilateral maxillary sinus.  No zygomatic fracture is noted. No nasal bone fracture. There is soft tissue swelling in right lateral orbital region and right zygomatic region. There is mild subcutaneous stranding. This is best seen in axial image 12 and coronal image 35.  Coronal images shows no intraorbital hematoma. No orbital rim or orbital floor fracture. There is nasal mucosal thickening bilaterally especially superior aspect superior turbinates.  No facial fluid collection is noted. No mandibular fracture. No TMJ dislocation. Bilateral eye globe is symmetrical in appearance. Sagittal images shows patent nasopharyngeal and oropharyngeal airway.  CT CERVICAL SPINE FINDINGS  Axial images of the cervical spine shows no acute fracture or subluxation. Computer processed images shows no acute fracture or subluxation. No prevertebral soft tissue swelling. Cervical airway is patent. There is no pneumothorax in visualized lung apices.  Coronal images shows no acute fracture or subluxation.  IMPRESSION: 1. No acute intracranial abnormality. Extensive paranasal sinuses disease. 2. No facial fractures are noted. There is soft tissue swelling right periorbital and right lateral periorbital/zygomatic region. Mild subcutaneous stranding. No intraorbital hematoma. 3. No orbital rim or orbital floor fracture. 4. There is extensive paranasal sinuses disease as described above.  5. No cervical spine acute fracture or subluxation. 6. No zygomatic fracture.   Electronically Signed   By: Lahoma Crocker M.D.   On: 01/05/2014 21:04     EKG Interpretation None      MDM   Final diagnoses:  Vaso vagal episode  Facial contusion    EKG is normal. Eyes are normal. CT the head, facial bones, and neck are normal. Clinically cleared from her cervical collar. Think this was vasovagal in somewhat orthostatic. I feel this was acute coronary syndrome or arrhythmia. Plan is home symptom control.    Tanna Furry, MD 01/05/14 2130

## 2014-01-05 NOTE — ED Notes (Signed)
Explained delay in care; verbalized understanding.

## 2014-01-05 NOTE — ED Provider Notes (Signed)
Medical screening examination/treatment/procedure(s) were performed by non-physician practitioner and as supervising physician I was immediately available for consultation/collaboration.  Philipp Deputy, M.D.  Harden Mo, MD 01/05/14 [phone]

## 2014-01-05 NOTE — ED Notes (Signed)
Patient stretched this am, then had a syncopal episode, striking floor with right side of face, abrasion.  Patient is alert and oriented x 3 .  Mae x 4 soreness in neck, across shoulders

## 2014-01-05 NOTE — ED Provider Notes (Signed)
CSN: [id number]     Arrival date & time 01/05/14  1327 History   First MD Initiated Contact with Patient 01/05/14 1444     Chief Complaint  Patient presents with  . Loss of Consciousness   (Consider location/radiation/quality/duration/timing/severity/associated sxs/prior Treatment) HPI Comments: States immediately upon waking, she went to bathroom, took a tylenol and went back to bed. Upon waking later that morning, she then went to see her chiropractor. Her chiropractor advised she been seen and evaluated. She then went to lunch (around 12 noon) and noticed that she was having some discomfort with swallowing and then came to [facility] for evaluation. Denies symptoms preceding event. Denies previous syncopal event.   Patient is a 58 y.o. female presenting with syncope. The history is provided by the patient.  Loss of Consciousness Episode history:  Single Most recent episode:  Today Duration: unknown. Chronicity:  New Context comment:  Patient states she woke this morning, stood up and stretched and then passed out. Woke on the floor after unknown period of time with right facial injury. Witnessed: no   Associated symptoms: no chest pain, no confusion, no diaphoresis, no difficulty breathing, no dizziness, no fever, no focal sensory loss, no headaches, no malaise/fatigue, no nausea, no palpitations, no recent fall, no seizures, no shortness of breath, no visual change, no vomiting and no weakness     Past Medical History  Diagnosis Date  . Asthma   . GERD (gastroesophageal reflux disease)    Past Surgical History  Procedure Laterality Date  . Uterine cancer    . Abdominal hysterectomy    . Knee surgery     No family history on file. History  Substance Use Topics  . Smoking status: Never Smoker   . Smokeless tobacco: Not on file  . Alcohol Use: Yes   OB History   Grav Para Term Preterm Abortions TAB SAB Ect Mult Living                 Review of Systems  Constitutional: Negative  for fever, malaise/fatigue and diaphoresis.  Respiratory: Negative for shortness of breath.   Cardiovascular: Positive for syncope. Negative for chest pain and palpitations.  Gastrointestinal: Negative for nausea and vomiting.  Neurological: Negative for dizziness, seizures, weakness and headaches.  Psychiatric/Behavioral: Negative for confusion.  All other systems reviewed and are negative.    Allergies  Cyclobenzaprine hcl and Levofloxacin  Home Medications   Current Outpatient Rx  Name  Route  Sig  Dispense  Refill  . ALBUTEROL IN   Inhalation   Inhale into the lungs.         . Budesonide-Formoterol Fumarate (SYMBICORT IN)   Inhalation   Inhale into the lungs.         . BuPROPion HCl (WELLBUTRIN PO)   Oral   Take by mouth.         . calcium carbonate 200 MG capsule   Oral   Take 250 mg by mouth 2 (two) times daily with a meal.         . GABAPENTIN PO   Oral   Take by mouth.         Marland Kitchen MAGNESIUM PO   Oral   Take by mouth.         . Montelukast Sodium (SINGULAIR PO)   Oral   Take by mouth.         Marland Kitchen OVER THE COUNTER MEDICATION      Vitamin b, vitamin k, vitamin d ,  vitamin c         . Probiotic Product (PROBIOTIC DAILY PO)   Oral   Take by mouth.         . SERTRALINE HCL PO   Oral   Take by mouth.         . Teriparatide, Recombinant, (FORTEO [place])   Subcutaneous   Inject into the skin.         . Triamcinolone Acetonide (NASACORT ALLERGY 24HR NA)   Nasal   Place into the nose.         Marland Kitchen VITAMIN K PO   Oral   Take by mouth.         . Zinc Sulfate (ZINC 15 PO)   Oral   Take by mouth.          BP 144/82  Pulse 70  Temp(Src) 97.5 F (36.4 C) (Oral)  Resp 16  SpO2 100% Physical Exam  Nursing note and vitals reviewed. Constitutional: She is oriented to person, place, and time. She appears well-developed and well-nourished. No distress.  HENT:  Head: Normocephalic. Head is with abrasion and with contusion.     Right Ear: External ear normal.  Left Ear: External ear normal.  Nose: Nose normal. Right sinus exhibits no maxillary sinus tenderness and no frontal sinus tenderness.  Mouth/Throat: Uvula is midline, oropharynx is clear and moist and mucous membranes are normal.  Outlined area on diagram with moderate abrasion, ecchymosis and STS. Palpable defect with palpation along right supraorbital margin. No dental injury or malocclusion. No hyphema.  Eyes: Conjunctivae and EOM are normal. Pupils are equal, round, and reactive to light.  Slit lamp exam:      The right eye shows no hyphema.  Neck: Normal range of motion. Neck supple. Muscular tenderness present.    Outlined area is area of tenderness with palpation  Cardiovascular: Normal rate.   Pulmonary/Chest: Effort normal.  Abdominal: Soft. Bowel sounds are normal. She exhibits no distension. There is no tenderness.  Musculoskeletal: Normal range of motion.  Neurological: She is alert and oriented to person, place, and time. She has normal strength. Coordination and gait normal. GCS eye subscore is 4. GCS verbal subscore is 5. GCS motor subscore is 6.  Skin: Skin is warm and dry.  Psychiatric: She has a normal mood and affect. Her behavior is normal.    ED Course  Procedures (including critical care time) Labs Review Labs Reviewed - No data to display Imaging Review No results found.   MDM   1. Syncope   2. Facial injury   Syncope of unknown origin (perhaps orthostasis given context of event) Facial Injury: exam suggests irregularity at right supraorbital margin. Will transfer to ER for facial imaging.    [place], [place] 01/05/14 1525

## 2014-02-19 ENCOUNTER — Other Ambulatory Visit: Payer: Self-pay

## 2014-02-19 DIAGNOSIS — Z1231 Encounter for screening mammogram for malignant neoplasm of breast: Secondary | ICD-10-CM

## 2014-03-08 ENCOUNTER — Ambulatory Visit: Payer: Medicare Other

## 2014-05-28 ENCOUNTER — Ambulatory Visit
Admission: RE | Admit: 2014-05-28 | Discharge: 2014-05-28 | Disposition: A | Discharge status: Home / Self Care | Payer: Medicare Other | Source: Ambulatory Visit

## 2014-05-28 DIAGNOSIS — Z1231 Encounter for screening mammogram for malignant neoplasm of breast: Secondary | ICD-10-CM

## 2014-06-12 DIAGNOSIS — J45909 Unspecified asthma, uncomplicated: Secondary | ICD-10-CM | POA: Insufficient documentation

## 2014-06-12 DIAGNOSIS — F339 Major depressive disorder, recurrent, unspecified: Secondary | ICD-10-CM | POA: Insufficient documentation

## 2014-06-12 DIAGNOSIS — E039 Hypothyroidism, unspecified: Secondary | ICD-10-CM | POA: Insufficient documentation

## 2014-06-12 DIAGNOSIS — F419 Anxiety disorder, unspecified: Secondary | ICD-10-CM | POA: Insufficient documentation

## 2014-06-12 DIAGNOSIS — J329 Chronic sinusitis, unspecified: Secondary | ICD-10-CM | POA: Insufficient documentation

## 2014-06-12 DIAGNOSIS — K219 Gastro-esophageal reflux disease without esophagitis: Secondary | ICD-10-CM | POA: Insufficient documentation

## 2014-06-12 DIAGNOSIS — J3089 Other allergic rhinitis: Secondary | ICD-10-CM | POA: Insufficient documentation

## 2014-06-12 DIAGNOSIS — G4700 Insomnia, unspecified: Secondary | ICD-10-CM | POA: Insufficient documentation

## 2014-08-17 DIAGNOSIS — C779 Secondary and unspecified malignant neoplasm of lymph node, unspecified: Secondary | ICD-10-CM | POA: Insufficient documentation

## 2014-08-17 DIAGNOSIS — R339 Retention of urine, unspecified: Secondary | ICD-10-CM | POA: Insufficient documentation

## 2014-08-17 DIAGNOSIS — M810 Age-related osteoporosis without current pathological fracture: Secondary | ICD-10-CM | POA: Insufficient documentation

## 2014-08-17 DIAGNOSIS — G629 Polyneuropathy, unspecified: Secondary | ICD-10-CM | POA: Insufficient documentation

## 2014-08-17 DIAGNOSIS — E041 Nontoxic single thyroid nodule: Secondary | ICD-10-CM | POA: Insufficient documentation

## 2014-08-17 DIAGNOSIS — E8940 Asymptomatic postprocedural ovarian failure: Secondary | ICD-10-CM | POA: Insufficient documentation

## 2014-08-17 DIAGNOSIS — N952 Postmenopausal atrophic vaginitis: Secondary | ICD-10-CM | POA: Insufficient documentation

## 2014-08-17 DIAGNOSIS — D126 Benign neoplasm of colon, unspecified: Secondary | ICD-10-CM | POA: Insufficient documentation

## 2014-08-17 DIAGNOSIS — K5730 Diverticulosis of large intestine without perforation or abscess without bleeding: Secondary | ICD-10-CM | POA: Insufficient documentation

## 2014-08-17 DIAGNOSIS — C55 Malignant neoplasm of uterus, part unspecified: Secondary | ICD-10-CM | POA: Insufficient documentation

## 2014-08-17 DIAGNOSIS — T66XXXS Radiation sickness, unspecified, sequela: Secondary | ICD-10-CM | POA: Insufficient documentation

## 2014-08-17 DIAGNOSIS — N951 Menopausal and female climacteric states: Secondary | ICD-10-CM | POA: Insufficient documentation

## 2014-08-17 DIAGNOSIS — R911 Solitary pulmonary nodule: Secondary | ICD-10-CM | POA: Insufficient documentation

## 2015-03-29 IMAGING — MG MM DIGITAL SCREENING BILAT W/ CAD
4 series · 4 of 4 positions shown · non-contrast
Comparison: Previous exam(s).

CLINICAL DATA: Screening.

EXAM:
DIGITAL SCREENING BILATERAL MAMMOGRAM WITH CAD

[R CC]
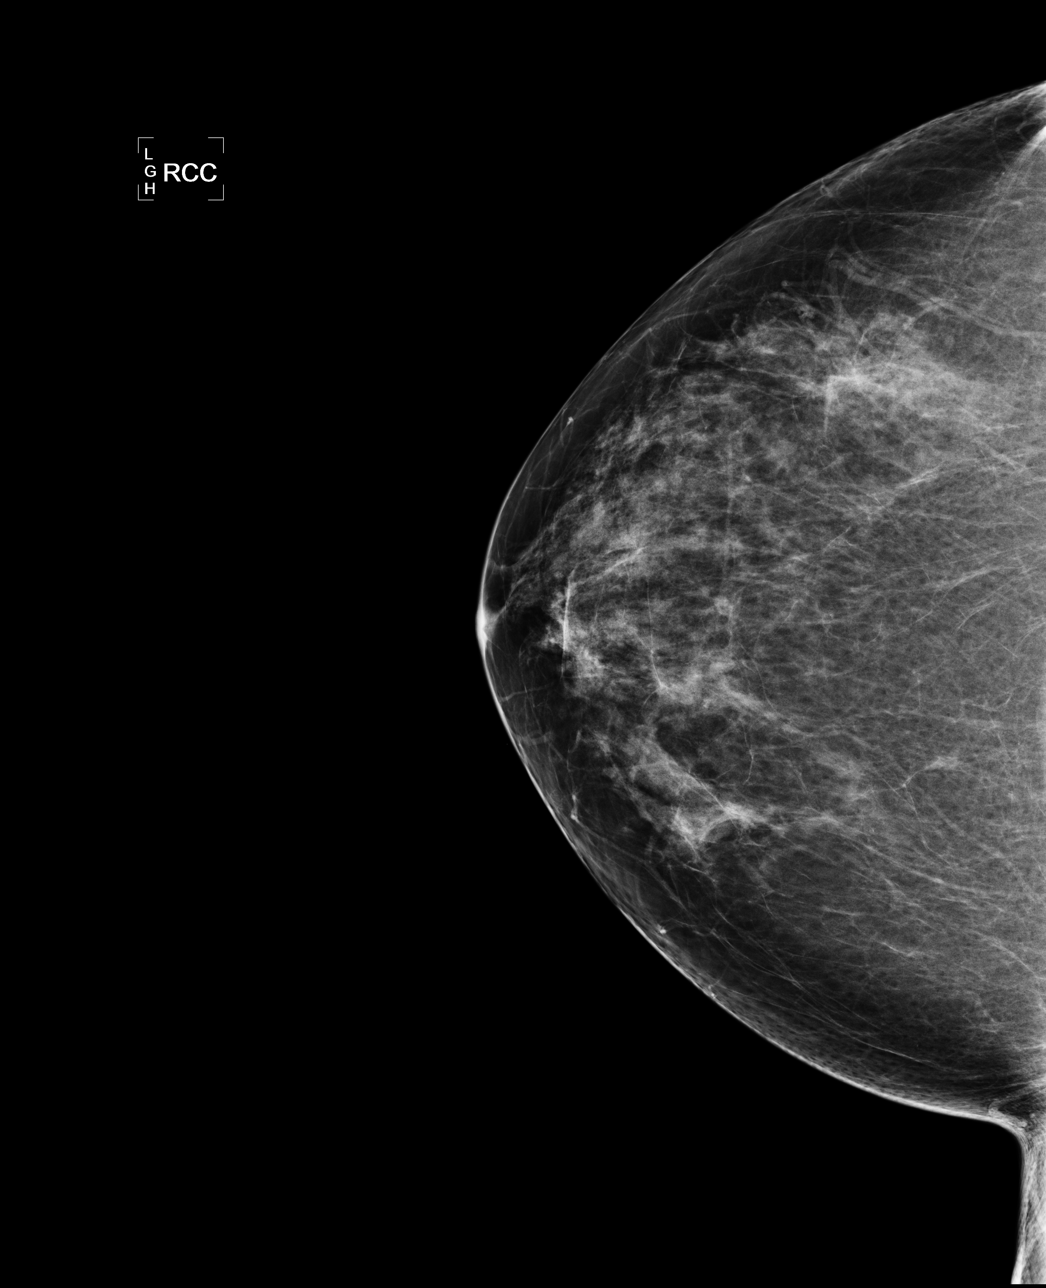

[L CC]
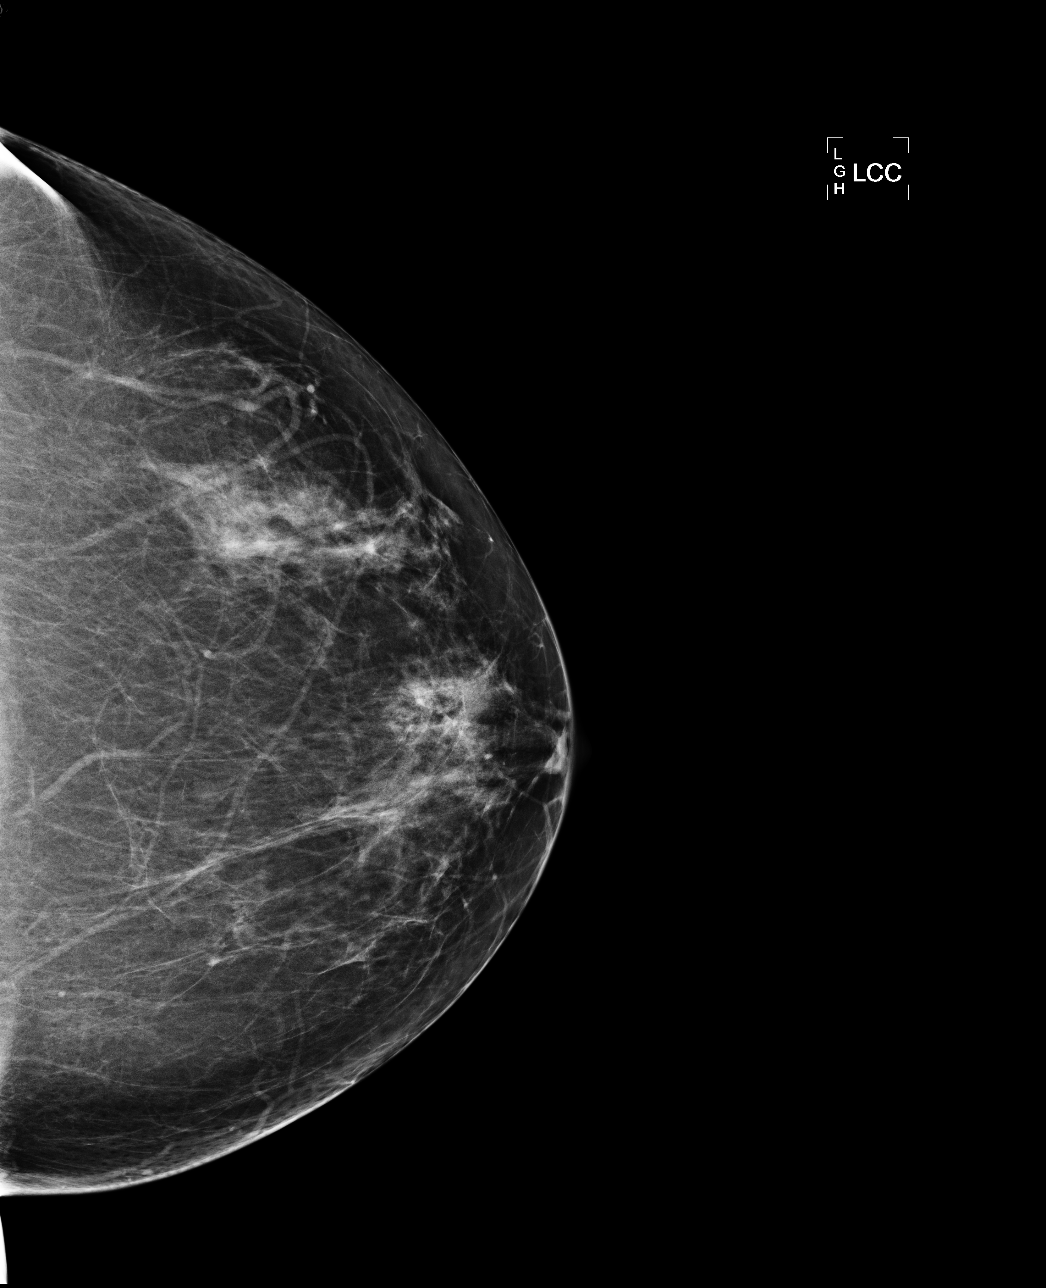

[L MLO]
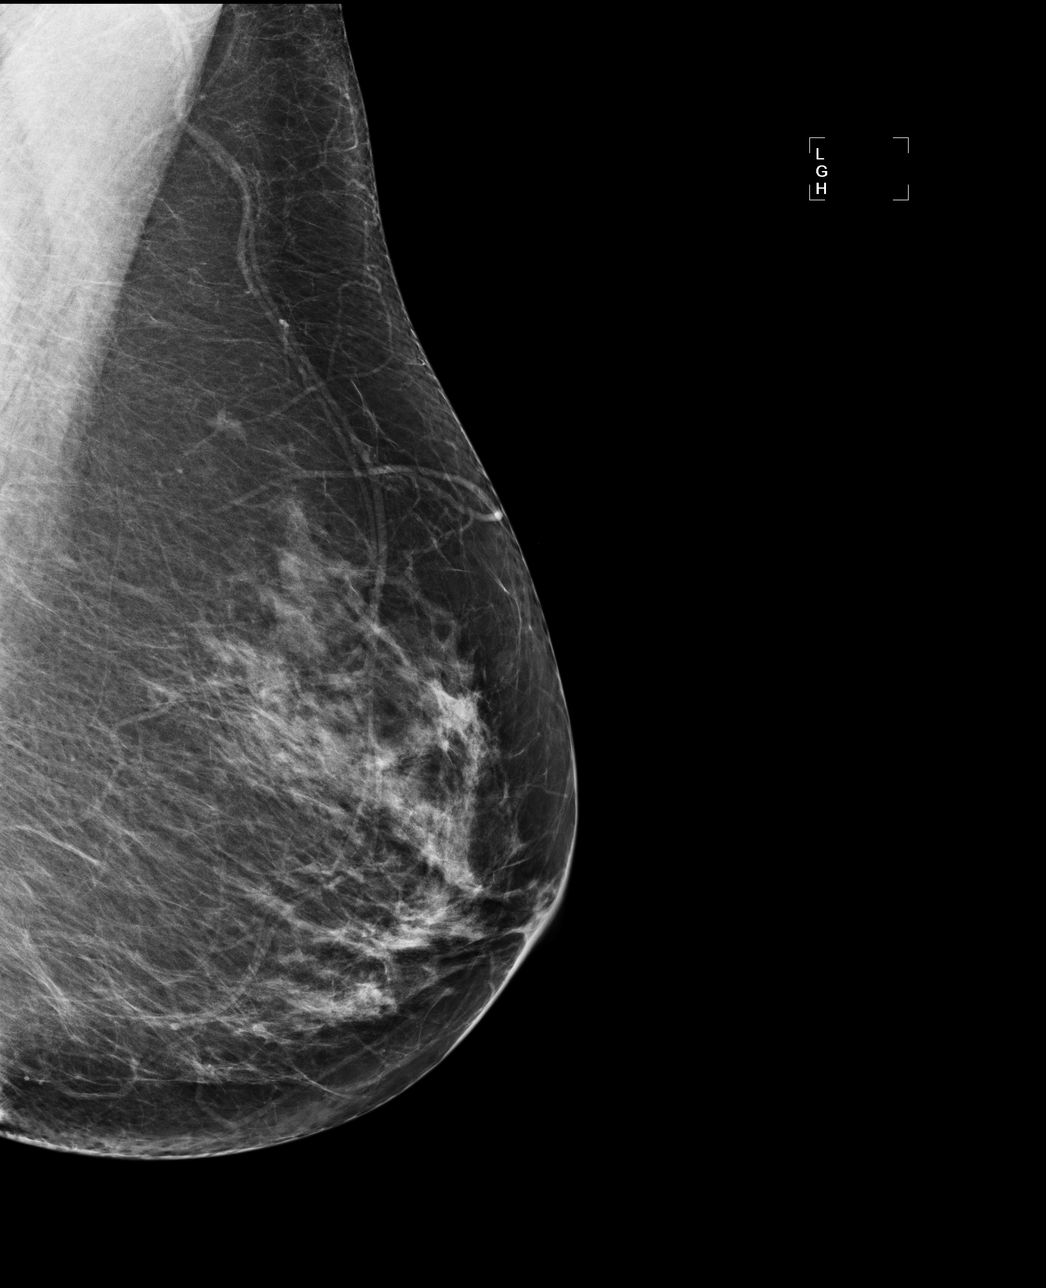

[R MLO]
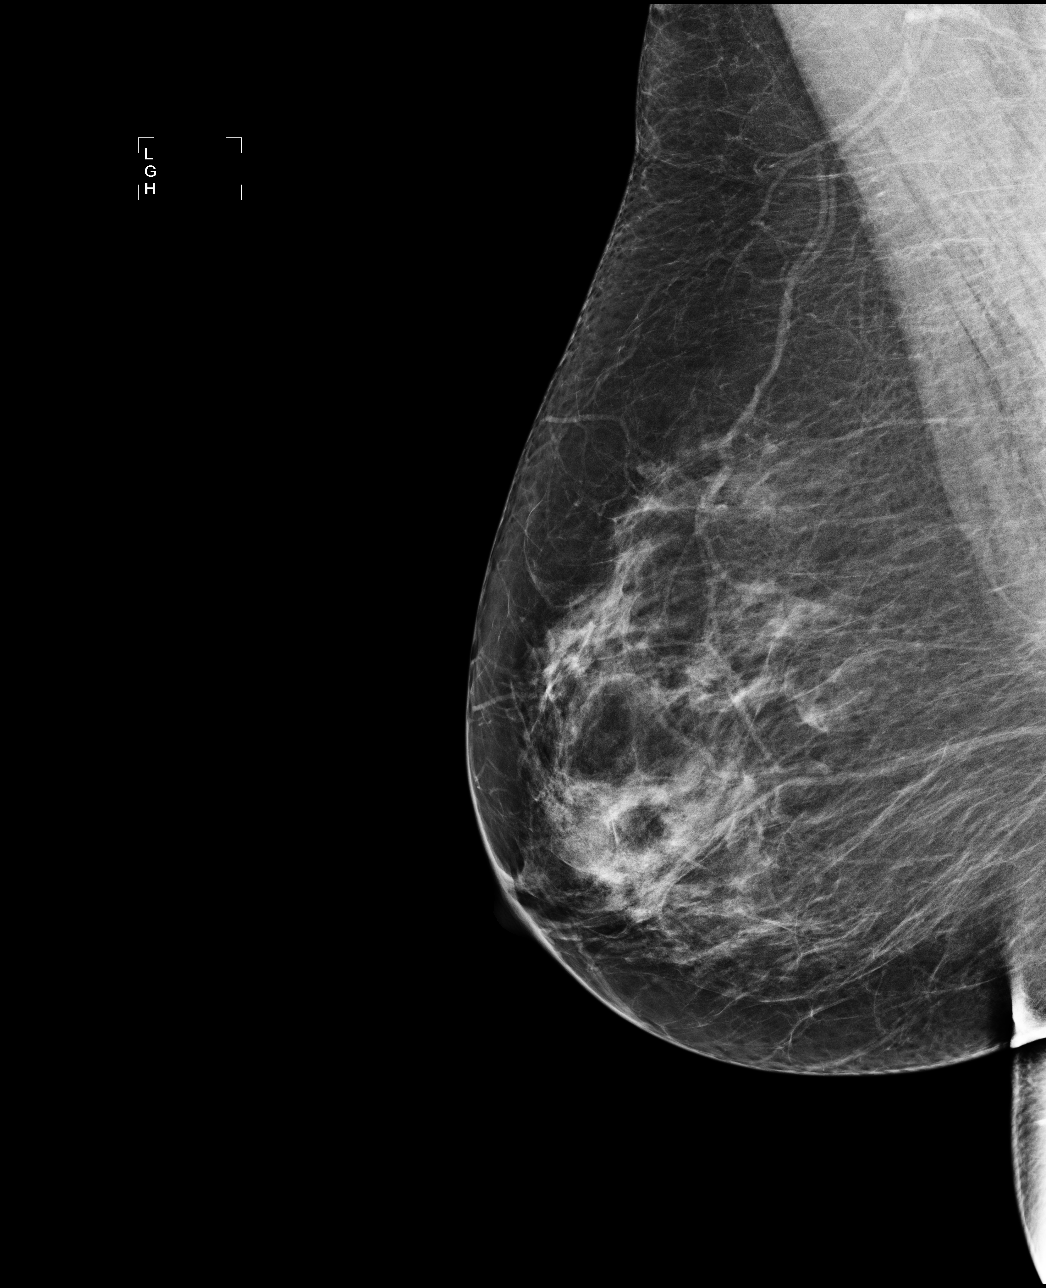

[4 of 4 positions shown; findings below may reference images not displayed]

ACR Breast Density Category b: There are scattered areas of
fibroglandular density.
FINDINGS: There are no findings suspicious for malignancy. Images were
processed with CAD.
IMPRESSION: No mammographic evidence of malignancy. A result letter of this
screening mammogram will be mailed directly to the patient.

RECOMMENDATION:
Screening mammogram in one year. (Code:AS-G-LCT)

BI-RADS CATEGORY  1: Negative.

## 2015-09-04 ENCOUNTER — Encounter: Payer: Self-pay | Admitting: Internal Medicine

## 2015-09-05 ENCOUNTER — Ambulatory Visit (INDEPENDENT_AMBULATORY_CARE_PROVIDER_SITE_OTHER): Payer: PPO | Admitting: Pediatrics

## 2015-09-05 ENCOUNTER — Encounter: Payer: Self-pay | Admitting: Pediatrics

## 2015-09-05 VITALS — BP 116/78 | HR 88 | Temp 97.3°F | Resp 18 | Ht 61.0 in

## 2015-09-05 DIAGNOSIS — Z23 Encounter for immunization: Secondary | ICD-10-CM

## 2015-09-05 DIAGNOSIS — J3089 Other allergic rhinitis: Secondary | ICD-10-CM

## 2015-09-05 DIAGNOSIS — K219 Gastro-esophageal reflux disease without esophagitis: Secondary | ICD-10-CM

## 2015-09-05 DIAGNOSIS — J4540 Moderate persistent asthma, uncomplicated: Secondary | ICD-10-CM

## 2015-09-05 MED ORDER — MONTELUKAST SODIUM 10 MG PO TABS: 10.0000 mg | ORAL_TABLET | Freq: Every day | ORAL | Status: DC

## 2015-09-05 MED ORDER — ALBUTEROL SULFATE HFA 108 (90 BASE) MCG/ACT IN AERS: 2.0000 | INHALATION_SPRAY | Freq: Four times a day (QID) | RESPIRATORY_TRACT | Status: DC | PRN

## 2015-09-05 MED ORDER — MOMETASONE FURO-FORMOTEROL FUM 200-5 MCG/ACT IN AERO: 2.0000 | INHALATION_SPRAY | Freq: Two times a day (BID) | RESPIRATORY_TRACT | Status: DC

## 2015-09-05 MED ORDER — INFLUENZA VAC SPLIT QUAD 0.5 ML IM SUSY
0.5000 mL | PREFILLED_SYRINGE | Freq: Once | INTRAMUSCULAR | Status: AC
  Administered 2015-09-05: 0.5 mL via INTRAMUSCULAR

## 2015-09-05 NOTE — Progress Notes (Signed)
  [place] [address] Dept: [phone]  FOLLOW UP NOTE  Patient ID: Whitney Wallace, female    DOB: Jan 19, 1956  Age: 59 y.o. MRN: [id number] Date of Office Visit: 09/05/2015  Assessment Chief Complaint: Follow-up  HPI Whitney Wallace presents for follow-up of her asthma and allergic rhinitis. Her asthma is well controlled and she is not having any coughing wheezing or shortness of breath. She has not had a flu vaccination. Her gastroesophageal reflux is under control  Current medications are Dulera 200-2 puffs twice a day, montelukast  10 mg once a day, loratadine 10 mg once a day and albuterol 0.083% one unit dose every 4 hours if needed. Her other medications are outlined in the chart.   Drug Allergies:  Allergies  Allergen Reactions  . Cyclobenzaprine Hcl Other (See Comments)    Ringing in ears  . Flexeril [Cyclobenzaprine]     Physical Exam: BP 116/78 mmHg  Pulse 88  Temp(Src) 97.3 F (36.3 C) (Oral)  Resp 18  Ht 5\' 1"  (1.549 m)   Physical Exam  Constitutional: She is oriented to person, place, and time. She appears well-developed and well-nourished.  HENT:  Eyes normal. Ears normal. Nose normal. Pharynx normal.  Neck: Neck supple.  Cardiovascular:  S1 and S2 normal no murmurs  Pulmonary/Chest:  Clear to percussion and auscultation  Lymphadenopathy:    She has no cervical adenopathy.  Neurological: She is alert and oriented to person, place, and time.    Diagnostics:  FVC 2.74 L FEV1 2.02 L. Predicted FVC 3.03 L predicted FEV1 2.36 L-spirometry is in the normal range  Assessment and Plan: 1. Need for prophylactic vaccination and inoculation against influenza   2. Moderate persistent asthma, uncomplicated   3. Other allergic rhinitis   4. Gastroesophageal reflux disease without esophagitis     Meds ordered this encounter  Medications  . Influenza vac split quadrivalent PF (FLUARIX) injection 0.5 mL    Sig:   . montelukast  (SINGULAIR) 10 MG tablet    Sig: Take 1 tablet (10 mg total) by mouth at bedtime.    Dispense:  30 tablet    Refill:  5  . mometasone-formoterol (DULERA) 200-5 MCG/ACT AERO    Sig: Inhale 2 puffs into the lungs 2 (two) times daily.    Dispense:  1 Inhaler    Refill:  5    On hold, patient will call  . albuterol (PROVENTIL HFA;VENTOLIN HFA) 108 (90 BASE) MCG/ACT inhaler    Sig: Inhale 2 puffs into the lungs every 6 (six) hours as needed for wheezing or shortness of breath.    Dispense:  1 Inhaler    Refill:  2    Patient Instructions  Continue on the treatment plan outlined above. At her request she received the flu vaccination Follow-up in 6 months but she will call me she's not doing well on this treatment plan    Return in about 6 months (around 03/05/2016).    Thank you for the opportunity to care for this patient.  Please do not hesitate to contact me with questions.  Penne Lash, M.D.  [facility] [address] [place], [place] [address] [phone]

## 2015-09-05 NOTE — Patient Instructions (Signed)
Continue on the treatment plan outlined above. At her request she received the flu vaccination Follow-up in 6 months but she will call me she's not doing well on this treatment plan

## 2015-09-23 DIAGNOSIS — G5603 Carpal tunnel syndrome, bilateral upper limbs: Secondary | ICD-10-CM | POA: Insufficient documentation

## 2015-11-13 DIAGNOSIS — R933 Abnormal findings on diagnostic imaging of other parts of digestive tract: Secondary | ICD-10-CM | POA: Diagnosis not present

## 2015-11-13 DIAGNOSIS — Z86010 Personal history of colonic polyps: Secondary | ICD-10-CM | POA: Diagnosis not present

## 2015-11-13 DIAGNOSIS — K219 Gastro-esophageal reflux disease without esophagitis: Secondary | ICD-10-CM | POA: Diagnosis not present

## 2015-11-13 DIAGNOSIS — K649 Unspecified hemorrhoids: Secondary | ICD-10-CM | POA: Diagnosis not present

## 2015-11-13 DIAGNOSIS — K921 Melena: Secondary | ICD-10-CM | POA: Diagnosis not present

## 2015-11-22 DIAGNOSIS — Z86010 Personal history of colonic polyps: Secondary | ICD-10-CM | POA: Diagnosis not present

## 2015-11-22 DIAGNOSIS — R933 Abnormal findings on diagnostic imaging of other parts of digestive tract: Secondary | ICD-10-CM | POA: Diagnosis not present

## 2015-11-22 DIAGNOSIS — K649 Unspecified hemorrhoids: Secondary | ICD-10-CM | POA: Diagnosis not present

## 2015-12-18 DIAGNOSIS — Z79899 Other long term (current) drug therapy: Secondary | ICD-10-CM | POA: Diagnosis not present

## 2015-12-18 DIAGNOSIS — G5601 Carpal tunnel syndrome, right upper limb: Secondary | ICD-10-CM | POA: Diagnosis not present

## 2015-12-18 DIAGNOSIS — K219 Gastro-esophageal reflux disease without esophagitis: Secondary | ICD-10-CM | POA: Diagnosis not present

## 2015-12-18 DIAGNOSIS — M810 Age-related osteoporosis without current pathological fracture: Secondary | ICD-10-CM | POA: Diagnosis not present

## 2015-12-18 DIAGNOSIS — Z8542 Personal history of malignant neoplasm of other parts of uterus: Secondary | ICD-10-CM | POA: Diagnosis not present

## 2015-12-18 DIAGNOSIS — E039 Hypothyroidism, unspecified: Secondary | ICD-10-CM | POA: Diagnosis not present

## 2016-02-07 DIAGNOSIS — M65312 Trigger thumb, left thumb: Secondary | ICD-10-CM | POA: Diagnosis not present

## 2016-02-07 DIAGNOSIS — Z23 Encounter for immunization: Secondary | ICD-10-CM | POA: Diagnosis not present

## 2016-02-07 DIAGNOSIS — G5603 Carpal tunnel syndrome, bilateral upper limbs: Secondary | ICD-10-CM | POA: Diagnosis not present

## 2016-02-07 DIAGNOSIS — M65311 Trigger thumb, right thumb: Secondary | ICD-10-CM | POA: Diagnosis not present

## 2016-02-18 DIAGNOSIS — Z90710 Acquired absence of both cervix and uterus: Secondary | ICD-10-CM | POA: Diagnosis not present

## 2016-02-18 DIAGNOSIS — C770 Secondary and unspecified malignant neoplasm of lymph nodes of head, face and neck: Secondary | ICD-10-CM | POA: Diagnosis not present

## 2016-02-18 DIAGNOSIS — G622 Polyneuropathy due to other toxic agents: Secondary | ICD-10-CM | POA: Diagnosis not present

## 2016-02-18 DIAGNOSIS — Z79899 Other long term (current) drug therapy: Secondary | ICD-10-CM | POA: Diagnosis not present

## 2016-02-18 DIAGNOSIS — C55 Malignant neoplasm of uterus, part unspecified: Secondary | ICD-10-CM | POA: Diagnosis not present

## 2016-02-18 DIAGNOSIS — D492 Neoplasm of unspecified behavior of bone, soft tissue, and skin: Secondary | ICD-10-CM | POA: Diagnosis not present

## 2016-02-18 DIAGNOSIS — J45909 Unspecified asthma, uncomplicated: Secondary | ICD-10-CM | POA: Diagnosis not present

## 2016-02-18 DIAGNOSIS — M6530 Trigger finger, unspecified finger: Secondary | ICD-10-CM | POA: Diagnosis not present

## 2016-02-18 DIAGNOSIS — Z8042 Family history of malignant neoplasm of prostate: Secondary | ICD-10-CM | POA: Diagnosis not present

## 2016-02-18 DIAGNOSIS — J302 Other seasonal allergic rhinitis: Secondary | ICD-10-CM | POA: Diagnosis not present

## 2016-05-20 DIAGNOSIS — C541 Malignant neoplasm of endometrium: Secondary | ICD-10-CM | POA: Diagnosis not present

## 2016-05-20 DIAGNOSIS — C779 Secondary and unspecified malignant neoplasm of lymph node, unspecified: Secondary | ICD-10-CM | POA: Diagnosis not present

## 2016-05-20 DIAGNOSIS — Z9221 Personal history of antineoplastic chemotherapy: Secondary | ICD-10-CM | POA: Diagnosis not present

## 2016-05-20 DIAGNOSIS — C778 Secondary and unspecified malignant neoplasm of lymph nodes of multiple regions: Secondary | ICD-10-CM | POA: Diagnosis not present

## 2016-05-20 DIAGNOSIS — Z90710 Acquired absence of both cervix and uterus: Secondary | ICD-10-CM | POA: Diagnosis not present

## 2016-05-20 DIAGNOSIS — C770 Secondary and unspecified malignant neoplasm of lymph nodes of head, face and neck: Secondary | ICD-10-CM | POA: Diagnosis not present

## 2016-05-20 DIAGNOSIS — R109 Unspecified abdominal pain: Secondary | ICD-10-CM | POA: Diagnosis not present

## 2016-05-20 DIAGNOSIS — R634 Abnormal weight loss: Secondary | ICD-10-CM | POA: Diagnosis not present

## 2016-05-20 DIAGNOSIS — C55 Malignant neoplasm of uterus, part unspecified: Secondary | ICD-10-CM | POA: Diagnosis not present

## 2016-05-20 DIAGNOSIS — Z79899 Other long term (current) drug therapy: Secondary | ICD-10-CM | POA: Diagnosis not present

## 2016-05-22 DIAGNOSIS — C7989 Secondary malignant neoplasm of other specified sites: Secondary | ICD-10-CM | POA: Diagnosis not present

## 2016-05-22 DIAGNOSIS — Z8542 Personal history of malignant neoplasm of other parts of uterus: Secondary | ICD-10-CM | POA: Diagnosis not present

## 2016-05-22 DIAGNOSIS — M87852 Other osteonecrosis, left femur: Secondary | ICD-10-CM | POA: Diagnosis not present

## 2016-05-22 DIAGNOSIS — R634 Abnormal weight loss: Secondary | ICD-10-CM | POA: Diagnosis not present

## 2016-05-22 DIAGNOSIS — K449 Diaphragmatic hernia without obstruction or gangrene: Secondary | ICD-10-CM | POA: Diagnosis not present

## 2016-05-22 DIAGNOSIS — M47814 Spondylosis without myelopathy or radiculopathy, thoracic region: Secondary | ICD-10-CM | POA: Diagnosis not present

## 2016-05-22 DIAGNOSIS — E041 Nontoxic single thyroid nodule: Secondary | ICD-10-CM | POA: Diagnosis not present

## 2016-05-22 DIAGNOSIS — C55 Malignant neoplasm of uterus, part unspecified: Secondary | ICD-10-CM | POA: Diagnosis not present

## 2016-05-22 DIAGNOSIS — M47816 Spondylosis without myelopathy or radiculopathy, lumbar region: Secondary | ICD-10-CM | POA: Diagnosis not present

## 2016-05-22 DIAGNOSIS — J984 Other disorders of lung: Secondary | ICD-10-CM | POA: Diagnosis not present

## 2016-05-22 DIAGNOSIS — M87851 Other osteonecrosis, right femur: Secondary | ICD-10-CM | POA: Diagnosis not present

## 2016-05-22 DIAGNOSIS — Z90710 Acquired absence of both cervix and uterus: Secondary | ICD-10-CM | POA: Diagnosis not present

## 2016-06-15 DIAGNOSIS — H40023 Open angle with borderline findings, high risk, bilateral: Secondary | ICD-10-CM | POA: Diagnosis not present

## 2016-06-15 DIAGNOSIS — H11153 Pinguecula, bilateral: Secondary | ICD-10-CM | POA: Diagnosis not present

## 2016-06-15 DIAGNOSIS — H04123 Dry eye syndrome of bilateral lacrimal glands: Secondary | ICD-10-CM | POA: Diagnosis not present

## 2016-06-15 DIAGNOSIS — H5203 Hypermetropia, bilateral: Secondary | ICD-10-CM | POA: Diagnosis not present

## 2016-06-15 DIAGNOSIS — H25043 Posterior subcapsular polar age-related cataract, bilateral: Secondary | ICD-10-CM | POA: Diagnosis not present

## 2016-06-15 DIAGNOSIS — H524 Presbyopia: Secondary | ICD-10-CM | POA: Diagnosis not present

## 2016-06-15 DIAGNOSIS — H40033 Anatomical narrow angle, bilateral: Secondary | ICD-10-CM | POA: Diagnosis not present

## 2016-06-15 DIAGNOSIS — H2513 Age-related nuclear cataract, bilateral: Secondary | ICD-10-CM | POA: Diagnosis not present

## 2016-06-15 DIAGNOSIS — H52223 Regular astigmatism, bilateral: Secondary | ICD-10-CM | POA: Diagnosis not present

## 2016-06-15 DIAGNOSIS — H18413 Arcus senilis, bilateral: Secondary | ICD-10-CM | POA: Diagnosis not present

## 2016-06-15 DIAGNOSIS — H11423 Conjunctival edema, bilateral: Secondary | ICD-10-CM | POA: Diagnosis not present

## 2016-06-15 DIAGNOSIS — H25013 Cortical age-related cataract, bilateral: Secondary | ICD-10-CM | POA: Diagnosis not present

## 2016-07-01 DIAGNOSIS — F331 Major depressive disorder, recurrent, moderate: Secondary | ICD-10-CM | POA: Diagnosis not present

## 2016-07-01 DIAGNOSIS — K219 Gastro-esophageal reflux disease without esophagitis: Secondary | ICD-10-CM | POA: Diagnosis not present

## 2016-07-01 DIAGNOSIS — J209 Acute bronchitis, unspecified: Secondary | ICD-10-CM | POA: Diagnosis not present

## 2016-07-01 DIAGNOSIS — J4540 Moderate persistent asthma, uncomplicated: Secondary | ICD-10-CM | POA: Diagnosis not present

## 2016-07-01 DIAGNOSIS — E039 Hypothyroidism, unspecified: Secondary | ICD-10-CM | POA: Diagnosis not present

## 2016-07-01 DIAGNOSIS — F419 Anxiety disorder, unspecified: Secondary | ICD-10-CM | POA: Diagnosis not present

## 2016-07-08 DIAGNOSIS — G620 Drug-induced polyneuropathy: Secondary | ICD-10-CM | POA: Diagnosis not present

## 2016-07-08 DIAGNOSIS — E039 Hypothyroidism, unspecified: Secondary | ICD-10-CM | POA: Diagnosis not present

## 2016-07-08 DIAGNOSIS — Z01419 Encounter for gynecological examination (general) (routine) without abnormal findings: Secondary | ICD-10-CM | POA: Diagnosis not present

## 2016-07-08 DIAGNOSIS — T451X5A Adverse effect of antineoplastic and immunosuppressive drugs, initial encounter: Secondary | ICD-10-CM | POA: Diagnosis not present

## 2016-07-10 DIAGNOSIS — Z1231 Encounter for screening mammogram for malignant neoplasm of breast: Secondary | ICD-10-CM | POA: Diagnosis not present

## 2016-07-20 DIAGNOSIS — M79675 Pain in left toe(s): Secondary | ICD-10-CM | POA: Diagnosis not present

## 2016-07-20 DIAGNOSIS — M10372 Gout due to renal impairment, left ankle and foot: Secondary | ICD-10-CM | POA: Diagnosis not present

## 2016-07-20 DIAGNOSIS — Z23 Encounter for immunization: Secondary | ICD-10-CM | POA: Diagnosis not present

## 2016-07-20 DIAGNOSIS — M810 Age-related osteoporosis without current pathological fracture: Secondary | ICD-10-CM | POA: Diagnosis not present

## 2016-08-11 ENCOUNTER — Other Ambulatory Visit: Payer: Self-pay | Admitting: Pediatrics

## 2016-09-02 DIAGNOSIS — Z923 Personal history of irradiation: Secondary | ICD-10-CM | POA: Diagnosis not present

## 2016-09-02 DIAGNOSIS — J302 Other seasonal allergic rhinitis: Secondary | ICD-10-CM | POA: Diagnosis not present

## 2016-09-02 DIAGNOSIS — C778 Secondary and unspecified malignant neoplasm of lymph nodes of multiple regions: Secondary | ICD-10-CM | POA: Diagnosis not present

## 2016-09-02 DIAGNOSIS — C55 Malignant neoplasm of uterus, part unspecified: Secondary | ICD-10-CM | POA: Diagnosis not present

## 2016-09-02 DIAGNOSIS — Z8542 Personal history of malignant neoplasm of other parts of uterus: Secondary | ICD-10-CM | POA: Diagnosis not present

## 2016-09-02 DIAGNOSIS — M179 Osteoarthritis of knee, unspecified: Secondary | ICD-10-CM | POA: Diagnosis not present

## 2016-09-02 DIAGNOSIS — Z791 Long term (current) use of non-steroidal anti-inflammatories (NSAID): Secondary | ICD-10-CM | POA: Diagnosis not present

## 2016-09-02 DIAGNOSIS — Z90722 Acquired absence of ovaries, bilateral: Secondary | ICD-10-CM | POA: Diagnosis not present

## 2016-09-02 DIAGNOSIS — G629 Polyneuropathy, unspecified: Secondary | ICD-10-CM | POA: Diagnosis not present

## 2016-09-02 DIAGNOSIS — M810 Age-related osteoporosis without current pathological fracture: Secondary | ICD-10-CM | POA: Diagnosis not present

## 2016-09-02 DIAGNOSIS — Z8579 Personal history of other malignant neoplasms of lymphoid, hematopoietic and related tissues: Secondary | ICD-10-CM | POA: Diagnosis not present

## 2016-09-02 DIAGNOSIS — Z08 Encounter for follow-up examination after completed treatment for malignant neoplasm: Secondary | ICD-10-CM | POA: Diagnosis not present

## 2016-09-02 DIAGNOSIS — G622 Polyneuropathy due to other toxic agents: Secondary | ICD-10-CM | POA: Diagnosis not present

## 2016-09-02 DIAGNOSIS — Z9221 Personal history of antineoplastic chemotherapy: Secondary | ICD-10-CM | POA: Diagnosis not present

## 2016-09-02 DIAGNOSIS — Z90710 Acquired absence of both cervix and uterus: Secondary | ICD-10-CM | POA: Diagnosis not present

## 2016-09-02 DIAGNOSIS — J45909 Unspecified asthma, uncomplicated: Secondary | ICD-10-CM | POA: Diagnosis not present

## 2016-09-02 DIAGNOSIS — E039 Hypothyroidism, unspecified: Secondary | ICD-10-CM | POA: Diagnosis not present

## 2016-09-02 DIAGNOSIS — F419 Anxiety disorder, unspecified: Secondary | ICD-10-CM | POA: Diagnosis not present

## 2016-09-02 DIAGNOSIS — F331 Major depressive disorder, recurrent, moderate: Secondary | ICD-10-CM | POA: Diagnosis not present

## 2016-09-02 DIAGNOSIS — R911 Solitary pulmonary nodule: Secondary | ICD-10-CM | POA: Diagnosis not present

## 2016-09-02 DIAGNOSIS — Z79899 Other long term (current) drug therapy: Secondary | ICD-10-CM | POA: Diagnosis not present

## 2016-09-02 DIAGNOSIS — Z79891 Long term (current) use of opiate analgesic: Secondary | ICD-10-CM | POA: Diagnosis not present

## 2016-09-02 DIAGNOSIS — K219 Gastro-esophageal reflux disease without esophagitis: Secondary | ICD-10-CM | POA: Diagnosis not present

## 2016-09-15 ENCOUNTER — Other Ambulatory Visit: Payer: Self-pay | Admitting: Pediatrics

## 2016-10-23 DIAGNOSIS — M818 Other osteoporosis without current pathological fracture: Secondary | ICD-10-CM | POA: Diagnosis not present

## 2016-10-23 DIAGNOSIS — M810 Age-related osteoporosis without current pathological fracture: Secondary | ICD-10-CM | POA: Diagnosis not present

## 2016-11-10 ENCOUNTER — Other Ambulatory Visit: Payer: Self-pay

## 2016-11-10 ENCOUNTER — Other Ambulatory Visit: Payer: Self-pay | Admitting: Allergy

## 2016-11-10 MED ORDER — MOMETASONE FURO-FORMOTEROL FUM 200-5 MCG/ACT IN AERO: 2.0000 | INHALATION_SPRAY | Freq: Two times a day (BID) | RESPIRATORY_TRACT | 1 refills | Status: DC

## 2016-11-12 DIAGNOSIS — J209 Acute bronchitis, unspecified: Secondary | ICD-10-CM | POA: Diagnosis not present

## 2016-11-12 DIAGNOSIS — J0100 Acute maxillary sinusitis, unspecified: Secondary | ICD-10-CM | POA: Diagnosis not present

## 2016-12-22 ENCOUNTER — Ambulatory Visit: Payer: PPO | Admitting: Pediatrics

## 2016-12-29 ENCOUNTER — Encounter: Payer: Self-pay | Admitting: Pediatrics

## 2016-12-29 ENCOUNTER — Ambulatory Visit (INDEPENDENT_AMBULATORY_CARE_PROVIDER_SITE_OTHER): Payer: PPO | Admitting: Pediatrics

## 2016-12-29 VITALS — BP 128/64 | HR 73 | Temp 98.1°F | Resp 16 | Ht 61.25 in | Wt 134.8 lb

## 2016-12-29 DIAGNOSIS — J3089 Other allergic rhinitis: Secondary | ICD-10-CM

## 2016-12-29 DIAGNOSIS — J4540 Moderate persistent asthma, uncomplicated: Secondary | ICD-10-CM | POA: Diagnosis not present

## 2016-12-29 DIAGNOSIS — K219 Gastro-esophageal reflux disease without esophagitis: Secondary | ICD-10-CM | POA: Diagnosis not present

## 2016-12-29 MED ORDER — MOMETASONE FURO-FORMOTEROL FUM 200-5 MCG/ACT IN AERO: 2.0000 | INHALATION_SPRAY | Freq: Two times a day (BID) | RESPIRATORY_TRACT | 5 refills | Status: DC

## 2016-12-29 MED ORDER — ALBUTEROL SULFATE HFA 108 (90 BASE) MCG/ACT IN AERS: 2.0000 | INHALATION_SPRAY | RESPIRATORY_TRACT | 2 refills | Status: DC | PRN

## 2016-12-29 MED ORDER — MONTELUKAST SODIUM 10 MG PO TABS: 10.0000 mg | ORAL_TABLET | Freq: Every day | ORAL | 5 refills | Status: DC

## 2016-12-29 MED ORDER — ALBUTEROL SULFATE (2.5 MG/3ML) 0.083% IN NEBU: 2.5000 mg | INHALATION_SOLUTION | RESPIRATORY_TRACT | 2 refills | Status: DC | PRN

## 2016-12-29 NOTE — Patient Instructions (Signed)
Continue on her current medications Call me if she is not doing well on this treatment plan 

## 2016-12-29 NOTE — Progress Notes (Signed)
[place] [address] Dept: [phone]  FOLLOW UP NOTE  Patient ID: Whitney Wallace, female    DOB: 1955-12-21  Age: 61 y.o. MRN: [id number] Date of Office Visit: 12/29/2016  Assessment  Chief Complaint: Sinus Problem (x 2 weeks )  HPI Whitney Wallace presents for follow-up of asthma and allergic rhinitis. She had a cold 2 weeks ago but the nasal congestion has dramatically improved 110 the past 2 days. She had a flu vaccination last fall.  Current medications are Dulera 200-2 puffs twice a day, montelukast  10 mg once a day, albuterol 0.083% one unit dose every 4 hours if needed, Ventolin 2 puffs every 4 hours if needed and loratadine 10 mg once a day. Her other medications are outlined in the chart.   Drug Allergies:  Allergies  Allergen Reactions  . Cyclobenzaprine Hcl Other (See Comments)    Ringing in ears  . Flexeril [Cyclobenzaprine]     Physical Exam: BP 128/64   Pulse 73   Temp 98.1 F (36.7 C) (Oral)   Resp 16   Ht 5' 1.25" (1.556 m)   Wt 134 lb 12.8 oz (61.1 kg)   SpO2 95%   BMI 25.26 kg/m    Physical Exam  Constitutional: She is oriented to person, place, and time. She appears well-developed and well-nourished.  HENT:  Eyes normal. Ears normal. Nose normal. Pharynx normal.  Neck: Neck supple.  Cardiovascular:  S1 and S2 normal no murmurs  Pulmonary/Chest:  Clear to percussion and auscultation  Lymphadenopathy:    She has no cervical adenopathy.  Neurological: She is alert and oriented to person, place, and time.  Psychiatric: She has a normal mood and affect. Her behavior is normal. Judgment normal.  Vitals reviewed.   Diagnostics:  FVC 3.18 L FEV1 2.37 L. Predicted FVC 2.98 L predicted FEV1 2.31 L-the spirometry is in the normal range  Assessment and Plan: 1. Gastroesophageal reflux disease without esophagitis   2. Moderate persistent asthma without complication   3. Other allergic rhinitis     Meds ordered this  encounter  Medications  . mometasone-formoterol (DULERA) 200-5 MCG/ACT AERO    Sig: Inhale 2 puffs into the lungs 2 (two) times daily.    Dispense:  1 Inhaler    Refill:  5    On hold, patient will call  . montelukast (SINGULAIR) 10 MG tablet    Sig: Take 1 tablet (10 mg total) by mouth at bedtime.    Dispense:  30 tablet    Refill:  5  . albuterol (PROVENTIL HFA;VENTOLIN HFA) 108 (90 Base) MCG/ACT inhaler    Sig: Inhale 2 puffs into the lungs every 4 (four) hours as needed for wheezing or shortness of breath.    Dispense:  1 Inhaler    Refill:  2    Place on hold, patient will call.  Marland Kitchen albuterol (PROVENTIL) (2.5 MG/3ML) 0.083% nebulizer solution    Sig: Take 3 mLs (2.5 mg total) by nebulization every 4 (four) hours as needed for wheezing or shortness of breath.    Dispense:  75 mL    Refill:  2    Place on hold, patient will call.    Patient Instructions  Continue on her current medications Call me if she is not doing well on this treatment plan   Return in about 1 year (around 12/29/2017).    Thank you for the opportunity to care for this patient.  Please do not hesitate to contact  me with questions.  Penne Lash, M.D.  [facility] [address] [place], [place] [address] [phone]

## 2016-12-31 DIAGNOSIS — M1712 Unilateral primary osteoarthritis, left knee: Secondary | ICD-10-CM | POA: Diagnosis not present

## 2016-12-31 DIAGNOSIS — G8929 Other chronic pain: Secondary | ICD-10-CM | POA: Diagnosis not present

## 2016-12-31 DIAGNOSIS — S83512S Sprain of anterior cruciate ligament of left knee, sequela: Secondary | ICD-10-CM | POA: Diagnosis not present

## 2016-12-31 DIAGNOSIS — M25562 Pain in left knee: Secondary | ICD-10-CM | POA: Diagnosis not present

## 2016-12-31 DIAGNOSIS — S76011S Strain of muscle, fascia and tendon of right hip, sequela: Secondary | ICD-10-CM | POA: Diagnosis not present

## 2016-12-31 DIAGNOSIS — R1031 Right lower quadrant pain: Secondary | ICD-10-CM | POA: Diagnosis not present

## 2017-01-01 DIAGNOSIS — J111 Influenza due to unidentified influenza virus with other respiratory manifestations: Secondary | ICD-10-CM | POA: Diagnosis not present

## 2017-01-04 NOTE — Addendum Note (Signed)
Addended by: Katherina Right D on: 01/04/2017 01:48 PM   Modules accepted: Orders

## 2017-01-23 DIAGNOSIS — J069 Acute upper respiratory infection, unspecified: Secondary | ICD-10-CM | POA: Diagnosis not present

## 2017-01-23 DIAGNOSIS — R509 Fever, unspecified: Secondary | ICD-10-CM | POA: Diagnosis not present

## 2017-01-26 DIAGNOSIS — H40003 Preglaucoma, unspecified, bilateral: Secondary | ICD-10-CM | POA: Diagnosis not present

## 2017-01-26 DIAGNOSIS — H25013 Cortical age-related cataract, bilateral: Secondary | ICD-10-CM | POA: Diagnosis not present

## 2017-01-26 DIAGNOSIS — H40023 Open angle with borderline findings, high risk, bilateral: Secondary | ICD-10-CM | POA: Diagnosis not present

## 2017-01-26 DIAGNOSIS — H2513 Age-related nuclear cataract, bilateral: Secondary | ICD-10-CM | POA: Diagnosis not present

## 2017-02-03 DIAGNOSIS — H16223 Keratoconjunctivitis sicca, not specified as Sjogren's, bilateral: Secondary | ICD-10-CM | POA: Diagnosis not present

## 2017-02-03 DIAGNOSIS — H04123 Dry eye syndrome of bilateral lacrimal glands: Secondary | ICD-10-CM | POA: Diagnosis not present

## 2017-02-23 DIAGNOSIS — J209 Acute bronchitis, unspecified: Secondary | ICD-10-CM | POA: Diagnosis not present

## 2017-02-23 DIAGNOSIS — J0100 Acute maxillary sinusitis, unspecified: Secondary | ICD-10-CM | POA: Diagnosis not present

## 2017-03-12 DIAGNOSIS — Z9119 Patient's noncompliance with other medical treatment and regimen: Secondary | ICD-10-CM | POA: Diagnosis not present

## 2017-03-12 DIAGNOSIS — Z9221 Personal history of antineoplastic chemotherapy: Secondary | ICD-10-CM | POA: Diagnosis not present

## 2017-03-12 DIAGNOSIS — J45909 Unspecified asthma, uncomplicated: Secondary | ICD-10-CM | POA: Diagnosis not present

## 2017-03-12 DIAGNOSIS — Z87891 Personal history of nicotine dependence: Secondary | ICD-10-CM | POA: Diagnosis not present

## 2017-03-12 DIAGNOSIS — J329 Chronic sinusitis, unspecified: Secondary | ICD-10-CM | POA: Diagnosis not present

## 2017-03-12 DIAGNOSIS — M810 Age-related osteoporosis without current pathological fracture: Secondary | ICD-10-CM | POA: Diagnosis not present

## 2017-03-12 DIAGNOSIS — M7989 Other specified soft tissue disorders: Secondary | ICD-10-CM | POA: Diagnosis not present

## 2017-03-12 DIAGNOSIS — M25531 Pain in right wrist: Secondary | ICD-10-CM | POA: Diagnosis not present

## 2017-03-12 DIAGNOSIS — Z90722 Acquired absence of ovaries, bilateral: Secondary | ICD-10-CM | POA: Diagnosis not present

## 2017-03-12 DIAGNOSIS — S52501A Unspecified fracture of the lower end of right radius, initial encounter for closed fracture: Secondary | ICD-10-CM | POA: Diagnosis not present

## 2017-03-12 DIAGNOSIS — W19XXXA Unspecified fall, initial encounter: Secondary | ICD-10-CM | POA: Diagnosis not present

## 2017-03-12 DIAGNOSIS — K219 Gastro-esophageal reflux disease without esophagitis: Secondary | ICD-10-CM | POA: Diagnosis not present

## 2017-03-15 DIAGNOSIS — S52571A Other intraarticular fracture of lower end of right radius, initial encounter for closed fracture: Secondary | ICD-10-CM | POA: Diagnosis not present

## 2017-03-15 DIAGNOSIS — M25531 Pain in right wrist: Secondary | ICD-10-CM | POA: Diagnosis not present

## 2017-03-22 DIAGNOSIS — F5101 Primary insomnia: Secondary | ICD-10-CM | POA: Diagnosis not present

## 2017-03-22 DIAGNOSIS — M25531 Pain in right wrist: Secondary | ICD-10-CM | POA: Diagnosis not present

## 2017-03-24 DIAGNOSIS — M25531 Pain in right wrist: Secondary | ICD-10-CM | POA: Diagnosis not present

## 2017-03-25 DIAGNOSIS — S52571A Other intraarticular fracture of lower end of right radius, initial encounter for closed fracture: Secondary | ICD-10-CM | POA: Diagnosis not present

## 2017-03-25 DIAGNOSIS — S52531P Colles' fracture of right radius, subsequent encounter for closed fracture with malunion: Secondary | ICD-10-CM | POA: Insufficient documentation

## 2017-03-25 DIAGNOSIS — G8918 Other acute postprocedural pain: Secondary | ICD-10-CM | POA: Diagnosis not present

## 2017-04-01 DIAGNOSIS — C778 Secondary and unspecified malignant neoplasm of lymph nodes of multiple regions: Secondary | ICD-10-CM | POA: Diagnosis not present

## 2017-04-01 DIAGNOSIS — C55 Malignant neoplasm of uterus, part unspecified: Secondary | ICD-10-CM | POA: Diagnosis not present

## 2017-04-07 DIAGNOSIS — S52501D Unspecified fracture of the lower end of right radius, subsequent encounter for closed fracture with routine healing: Secondary | ICD-10-CM | POA: Diagnosis not present

## 2017-04-09 DIAGNOSIS — F331 Major depressive disorder, recurrent, moderate: Secondary | ICD-10-CM | POA: Diagnosis not present

## 2017-04-09 DIAGNOSIS — F419 Anxiety disorder, unspecified: Secondary | ICD-10-CM | POA: Diagnosis not present

## 2017-04-09 DIAGNOSIS — E039 Hypothyroidism, unspecified: Secondary | ICD-10-CM | POA: Diagnosis not present

## 2017-04-09 DIAGNOSIS — K219 Gastro-esophageal reflux disease without esophagitis: Secondary | ICD-10-CM | POA: Diagnosis not present

## 2017-04-09 DIAGNOSIS — Z1211 Encounter for screening for malignant neoplasm of colon: Secondary | ICD-10-CM | POA: Diagnosis not present

## 2017-04-09 DIAGNOSIS — Z1231 Encounter for screening mammogram for malignant neoplasm of breast: Secondary | ICD-10-CM | POA: Diagnosis not present

## 2017-04-15 DIAGNOSIS — L718 Other rosacea: Secondary | ICD-10-CM | POA: Diagnosis not present

## 2017-04-19 DIAGNOSIS — S52501D Unspecified fracture of the lower end of right radius, subsequent encounter for closed fracture with routine healing: Secondary | ICD-10-CM | POA: Diagnosis not present

## 2017-04-28 DIAGNOSIS — S52501D Unspecified fracture of the lower end of right radius, subsequent encounter for closed fracture with routine healing: Secondary | ICD-10-CM | POA: Diagnosis not present

## 2017-04-29 DIAGNOSIS — F458 Other somatoform disorders: Secondary | ICD-10-CM | POA: Diagnosis not present

## 2017-04-29 DIAGNOSIS — R1314 Dysphagia, pharyngoesophageal phase: Secondary | ICD-10-CM | POA: Diagnosis not present

## 2017-05-03 DIAGNOSIS — S83512S Sprain of anterior cruciate ligament of left knee, sequela: Secondary | ICD-10-CM | POA: Diagnosis not present

## 2017-05-03 DIAGNOSIS — M1712 Unilateral primary osteoarthritis, left knee: Secondary | ICD-10-CM | POA: Diagnosis not present

## 2017-05-10 DIAGNOSIS — S0500XD Injury of conjunctiva and corneal abrasion without foreign body, unspecified eye, subsequent encounter: Secondary | ICD-10-CM | POA: Diagnosis not present

## 2017-05-10 DIAGNOSIS — H16223 Keratoconjunctivitis sicca, not specified as Sjogren's, bilateral: Secondary | ICD-10-CM | POA: Diagnosis not present

## 2017-05-10 DIAGNOSIS — H16143 Punctate keratitis, bilateral: Secondary | ICD-10-CM | POA: Diagnosis not present

## 2017-05-10 DIAGNOSIS — H04123 Dry eye syndrome of bilateral lacrimal glands: Secondary | ICD-10-CM | POA: Diagnosis not present

## 2017-05-11 DIAGNOSIS — J0100 Acute maxillary sinusitis, unspecified: Secondary | ICD-10-CM | POA: Diagnosis not present

## 2017-05-17 DIAGNOSIS — M25531 Pain in right wrist: Secondary | ICD-10-CM | POA: Diagnosis not present

## 2017-05-19 DIAGNOSIS — K2270 Barrett's esophagus without dysplasia: Secondary | ICD-10-CM | POA: Diagnosis not present

## 2017-05-19 DIAGNOSIS — K229 Disease of esophagus, unspecified: Secondary | ICD-10-CM | POA: Diagnosis not present

## 2017-05-19 DIAGNOSIS — R1314 Dysphagia, pharyngoesophageal phase: Secondary | ICD-10-CM | POA: Diagnosis not present

## 2017-05-21 DIAGNOSIS — M6281 Muscle weakness (generalized): Secondary | ICD-10-CM | POA: Diagnosis not present

## 2017-05-21 DIAGNOSIS — M25631 Stiffness of right wrist, not elsewhere classified: Secondary | ICD-10-CM | POA: Diagnosis not present

## 2017-05-21 DIAGNOSIS — S52501D Unspecified fracture of the lower end of right radius, subsequent encounter for closed fracture with routine healing: Secondary | ICD-10-CM | POA: Diagnosis not present

## 2017-05-21 DIAGNOSIS — M25531 Pain in right wrist: Secondary | ICD-10-CM | POA: Diagnosis not present

## 2017-05-24 DIAGNOSIS — Z1231 Encounter for screening mammogram for malignant neoplasm of breast: Secondary | ICD-10-CM | POA: Diagnosis not present

## 2017-05-25 DIAGNOSIS — M25631 Stiffness of right wrist, not elsewhere classified: Secondary | ICD-10-CM | POA: Diagnosis not present

## 2017-05-25 DIAGNOSIS — M25531 Pain in right wrist: Secondary | ICD-10-CM | POA: Diagnosis not present

## 2017-05-25 DIAGNOSIS — S52501D Unspecified fracture of the lower end of right radius, subsequent encounter for closed fracture with routine healing: Secondary | ICD-10-CM | POA: Diagnosis not present

## 2017-05-25 DIAGNOSIS — S25501D Unspecified injury of intercostal blood vessels, right side, subsequent encounter: Secondary | ICD-10-CM | POA: Diagnosis not present

## 2017-05-25 DIAGNOSIS — M6281 Muscle weakness (generalized): Secondary | ICD-10-CM | POA: Diagnosis not present

## 2017-05-27 DIAGNOSIS — M6281 Muscle weakness (generalized): Secondary | ICD-10-CM | POA: Diagnosis not present

## 2017-05-27 DIAGNOSIS — S52501D Unspecified fracture of the lower end of right radius, subsequent encounter for closed fracture with routine healing: Secondary | ICD-10-CM | POA: Diagnosis not present

## 2017-05-27 DIAGNOSIS — M25631 Stiffness of right wrist, not elsewhere classified: Secondary | ICD-10-CM | POA: Diagnosis not present

## 2017-05-27 DIAGNOSIS — M25531 Pain in right wrist: Secondary | ICD-10-CM | POA: Diagnosis not present

## 2017-06-01 DIAGNOSIS — M25531 Pain in right wrist: Secondary | ICD-10-CM | POA: Diagnosis not present

## 2017-06-01 DIAGNOSIS — M6281 Muscle weakness (generalized): Secondary | ICD-10-CM | POA: Diagnosis not present

## 2017-06-01 DIAGNOSIS — M25631 Stiffness of right wrist, not elsewhere classified: Secondary | ICD-10-CM | POA: Diagnosis not present

## 2017-06-01 DIAGNOSIS — S52501D Unspecified fracture of the lower end of right radius, subsequent encounter for closed fracture with routine healing: Secondary | ICD-10-CM | POA: Diagnosis not present

## 2017-06-03 DIAGNOSIS — M25631 Stiffness of right wrist, not elsewhere classified: Secondary | ICD-10-CM | POA: Diagnosis not present

## 2017-06-03 DIAGNOSIS — M25531 Pain in right wrist: Secondary | ICD-10-CM | POA: Diagnosis not present

## 2017-06-03 DIAGNOSIS — S52501D Unspecified fracture of the lower end of right radius, subsequent encounter for closed fracture with routine healing: Secondary | ICD-10-CM | POA: Diagnosis not present

## 2017-06-03 DIAGNOSIS — M6281 Muscle weakness (generalized): Secondary | ICD-10-CM | POA: Diagnosis not present

## 2017-06-08 DIAGNOSIS — M25531 Pain in right wrist: Secondary | ICD-10-CM | POA: Diagnosis not present

## 2017-06-08 DIAGNOSIS — S52501D Unspecified fracture of the lower end of right radius, subsequent encounter for closed fracture with routine healing: Secondary | ICD-10-CM | POA: Diagnosis not present

## 2017-06-08 DIAGNOSIS — M6281 Muscle weakness (generalized): Secondary | ICD-10-CM | POA: Diagnosis not present

## 2017-06-08 DIAGNOSIS — M25631 Stiffness of right wrist, not elsewhere classified: Secondary | ICD-10-CM | POA: Diagnosis not present

## 2017-06-10 DIAGNOSIS — M6281 Muscle weakness (generalized): Secondary | ICD-10-CM | POA: Diagnosis not present

## 2017-06-10 DIAGNOSIS — M25631 Stiffness of right wrist, not elsewhere classified: Secondary | ICD-10-CM | POA: Diagnosis not present

## 2017-06-10 DIAGNOSIS — M25531 Pain in right wrist: Secondary | ICD-10-CM | POA: Diagnosis not present

## 2017-06-10 DIAGNOSIS — S52501D Unspecified fracture of the lower end of right radius, subsequent encounter for closed fracture with routine healing: Secondary | ICD-10-CM | POA: Diagnosis not present

## 2017-06-22 DIAGNOSIS — S52501D Unspecified fracture of the lower end of right radius, subsequent encounter for closed fracture with routine healing: Secondary | ICD-10-CM | POA: Diagnosis not present

## 2017-06-22 DIAGNOSIS — M25531 Pain in right wrist: Secondary | ICD-10-CM | POA: Diagnosis not present

## 2017-06-22 DIAGNOSIS — M25631 Stiffness of right wrist, not elsewhere classified: Secondary | ICD-10-CM | POA: Diagnosis not present

## 2017-06-22 DIAGNOSIS — M6281 Muscle weakness (generalized): Secondary | ICD-10-CM | POA: Diagnosis not present

## 2017-06-29 DIAGNOSIS — M6281 Muscle weakness (generalized): Secondary | ICD-10-CM | POA: Diagnosis not present

## 2017-06-29 DIAGNOSIS — M25531 Pain in right wrist: Secondary | ICD-10-CM | POA: Diagnosis not present

## 2017-06-29 DIAGNOSIS — M25631 Stiffness of right wrist, not elsewhere classified: Secondary | ICD-10-CM | POA: Diagnosis not present

## 2017-06-29 DIAGNOSIS — S52501D Unspecified fracture of the lower end of right radius, subsequent encounter for closed fracture with routine healing: Secondary | ICD-10-CM | POA: Diagnosis not present

## 2017-07-27 DIAGNOSIS — J0100 Acute maxillary sinusitis, unspecified: Secondary | ICD-10-CM | POA: Diagnosis not present

## 2017-07-29 DIAGNOSIS — G5621 Lesion of ulnar nerve, right upper limb: Secondary | ICD-10-CM | POA: Insufficient documentation

## 2017-07-29 DIAGNOSIS — G5601 Carpal tunnel syndrome, right upper limb: Secondary | ICD-10-CM | POA: Diagnosis not present

## 2017-08-04 DIAGNOSIS — S52531D Colles' fracture of right radius, subsequent encounter for closed fracture with routine healing: Secondary | ICD-10-CM | POA: Diagnosis not present

## 2017-08-04 DIAGNOSIS — G5601 Carpal tunnel syndrome, right upper limb: Secondary | ICD-10-CM | POA: Diagnosis not present

## 2017-08-04 DIAGNOSIS — Z9689 Presence of other specified functional implants: Secondary | ICD-10-CM | POA: Diagnosis not present

## 2017-08-31 DIAGNOSIS — H02839 Dermatochalasis of unspecified eye, unspecified eyelid: Secondary | ICD-10-CM | POA: Diagnosis not present

## 2017-08-31 DIAGNOSIS — H2513 Age-related nuclear cataract, bilateral: Secondary | ICD-10-CM | POA: Diagnosis not present

## 2017-08-31 DIAGNOSIS — H25013 Cortical age-related cataract, bilateral: Secondary | ICD-10-CM | POA: Diagnosis not present

## 2017-08-31 DIAGNOSIS — H25043 Posterior subcapsular polar age-related cataract, bilateral: Secondary | ICD-10-CM | POA: Diagnosis not present

## 2017-08-31 DIAGNOSIS — H2512 Age-related nuclear cataract, left eye: Secondary | ICD-10-CM | POA: Diagnosis not present

## 2017-09-01 DIAGNOSIS — Z0000 Encounter for general adult medical examination without abnormal findings: Secondary | ICD-10-CM | POA: Diagnosis not present

## 2017-09-01 DIAGNOSIS — Z8542 Personal history of malignant neoplasm of other parts of uterus: Secondary | ICD-10-CM | POA: Diagnosis not present

## 2017-09-01 DIAGNOSIS — Z923 Personal history of irradiation: Secondary | ICD-10-CM | POA: Diagnosis not present

## 2017-09-01 DIAGNOSIS — R6889 Other general symptoms and signs: Secondary | ICD-10-CM | POA: Diagnosis not present

## 2017-09-01 DIAGNOSIS — Z01419 Encounter for gynecological examination (general) (routine) without abnormal findings: Secondary | ICD-10-CM | POA: Diagnosis not present

## 2017-09-01 DIAGNOSIS — Z9221 Personal history of antineoplastic chemotherapy: Secondary | ICD-10-CM | POA: Diagnosis not present

## 2017-09-01 DIAGNOSIS — Z1151 Encounter for screening for human papillomavirus (HPV): Secondary | ICD-10-CM | POA: Diagnosis not present

## 2017-09-13 DIAGNOSIS — F419 Anxiety disorder, unspecified: Secondary | ICD-10-CM | POA: Diagnosis not present

## 2017-09-13 DIAGNOSIS — E039 Hypothyroidism, unspecified: Secondary | ICD-10-CM | POA: Diagnosis not present

## 2017-09-13 DIAGNOSIS — F339 Major depressive disorder, recurrent, unspecified: Secondary | ICD-10-CM | POA: Diagnosis not present

## 2017-09-13 DIAGNOSIS — D72819 Decreased white blood cell count, unspecified: Secondary | ICD-10-CM | POA: Insufficient documentation

## 2017-09-13 DIAGNOSIS — G4700 Insomnia, unspecified: Secondary | ICD-10-CM | POA: Diagnosis not present

## 2017-09-14 DIAGNOSIS — G588 Other specified mononeuropathies: Secondary | ICD-10-CM | POA: Diagnosis not present

## 2017-09-14 DIAGNOSIS — M25531 Pain in right wrist: Secondary | ICD-10-CM | POA: Diagnosis not present

## 2017-09-14 DIAGNOSIS — G5601 Carpal tunnel syndrome, right upper limb: Secondary | ICD-10-CM | POA: Diagnosis not present

## 2017-09-14 DIAGNOSIS — G5681 Other specified mononeuropathies of right upper limb: Secondary | ICD-10-CM | POA: Diagnosis not present

## 2017-09-14 DIAGNOSIS — M24631 Ankylosis, right wrist: Secondary | ICD-10-CM | POA: Diagnosis not present

## 2017-09-14 DIAGNOSIS — Z472 Encounter for removal of internal fixation device: Secondary | ICD-10-CM | POA: Diagnosis not present

## 2017-09-14 DIAGNOSIS — G8918 Other acute postprocedural pain: Secondary | ICD-10-CM | POA: Diagnosis not present

## 2017-09-14 DIAGNOSIS — M24531 Contracture, right wrist: Secondary | ICD-10-CM | POA: Diagnosis not present

## 2017-09-14 DIAGNOSIS — G5621 Lesion of ulnar nerve, right upper limb: Secondary | ICD-10-CM | POA: Diagnosis not present

## 2017-09-29 DIAGNOSIS — J019 Acute sinusitis, unspecified: Secondary | ICD-10-CM | POA: Diagnosis not present

## 2017-10-04 ENCOUNTER — Other Ambulatory Visit: Payer: Self-pay | Admitting: Pediatrics

## 2017-10-04 ENCOUNTER — Other Ambulatory Visit: Payer: Self-pay

## 2017-10-04 DIAGNOSIS — C55 Malignant neoplasm of uterus, part unspecified: Secondary | ICD-10-CM | POA: Diagnosis not present

## 2017-10-04 DIAGNOSIS — Z923 Personal history of irradiation: Secondary | ICD-10-CM | POA: Diagnosis not present

## 2017-10-04 DIAGNOSIS — C779 Secondary and unspecified malignant neoplasm of lymph node, unspecified: Secondary | ICD-10-CM | POA: Diagnosis not present

## 2017-10-04 DIAGNOSIS — Z9221 Personal history of antineoplastic chemotherapy: Secondary | ICD-10-CM | POA: Diagnosis not present

## 2017-10-04 MED ORDER — MONTELUKAST SODIUM 10 MG PO TABS: 10.0000 mg | ORAL_TABLET | Freq: Every day | ORAL | 3 refills | Status: DC

## 2017-10-04 NOTE — Telephone Encounter (Signed)
RF on montelukast x 3 at [employer]

## 2017-10-07 DIAGNOSIS — Z4789 Encounter for other orthopedic aftercare: Secondary | ICD-10-CM | POA: Diagnosis not present

## 2017-10-07 DIAGNOSIS — G5621 Lesion of ulnar nerve, right upper limb: Secondary | ICD-10-CM | POA: Diagnosis not present

## 2017-10-07 DIAGNOSIS — G5601 Carpal tunnel syndrome, right upper limb: Secondary | ICD-10-CM | POA: Diagnosis not present

## 2017-10-07 DIAGNOSIS — M6281 Muscle weakness (generalized): Secondary | ICD-10-CM | POA: Diagnosis not present

## 2017-10-07 DIAGNOSIS — S52531D Colles' fracture of right radius, subsequent encounter for closed fracture with routine healing: Secondary | ICD-10-CM | POA: Diagnosis not present

## 2017-10-12 DIAGNOSIS — S52531D Colles' fracture of right radius, subsequent encounter for closed fracture with routine healing: Secondary | ICD-10-CM | POA: Diagnosis not present

## 2017-10-12 DIAGNOSIS — M6281 Muscle weakness (generalized): Secondary | ICD-10-CM | POA: Diagnosis not present

## 2017-10-12 DIAGNOSIS — G5621 Lesion of ulnar nerve, right upper limb: Secondary | ICD-10-CM | POA: Diagnosis not present

## 2017-10-12 DIAGNOSIS — G5601 Carpal tunnel syndrome, right upper limb: Secondary | ICD-10-CM | POA: Diagnosis not present

## 2017-10-12 DIAGNOSIS — Z4789 Encounter for other orthopedic aftercare: Secondary | ICD-10-CM | POA: Diagnosis not present

## 2017-10-19 ENCOUNTER — Ambulatory Visit: Payer: PPO | Admitting: Pediatrics

## 2017-10-26 DIAGNOSIS — W010XXD Fall on same level from slipping, tripping and stumbling without subsequent striking against object, subsequent encounter: Secondary | ICD-10-CM | POA: Diagnosis not present

## 2017-10-26 DIAGNOSIS — S6291XB Unspecified fracture of right wrist and hand, initial encounter for open fracture: Secondary | ICD-10-CM | POA: Diagnosis not present

## 2017-10-26 DIAGNOSIS — R278 Other lack of coordination: Secondary | ICD-10-CM | POA: Diagnosis not present

## 2017-10-26 DIAGNOSIS — R262 Difficulty in walking, not elsewhere classified: Secondary | ICD-10-CM | POA: Diagnosis not present

## 2017-10-28 DIAGNOSIS — J209 Acute bronchitis, unspecified: Secondary | ICD-10-CM | POA: Diagnosis not present

## 2017-10-28 DIAGNOSIS — J0100 Acute maxillary sinusitis, unspecified: Secondary | ICD-10-CM | POA: Diagnosis not present

## 2017-11-11 DIAGNOSIS — S6291XB Unspecified fracture of right wrist and hand, initial encounter for open fracture: Secondary | ICD-10-CM | POA: Diagnosis not present

## 2017-11-11 DIAGNOSIS — R278 Other lack of coordination: Secondary | ICD-10-CM | POA: Diagnosis not present

## 2017-11-11 DIAGNOSIS — M6281 Muscle weakness (generalized): Secondary | ICD-10-CM | POA: Diagnosis not present

## 2017-11-11 DIAGNOSIS — R262 Difficulty in walking, not elsewhere classified: Secondary | ICD-10-CM | POA: Diagnosis not present

## 2017-11-11 DIAGNOSIS — G5621 Lesion of ulnar nerve, right upper limb: Secondary | ICD-10-CM | POA: Diagnosis not present

## 2017-11-11 DIAGNOSIS — Z4789 Encounter for other orthopedic aftercare: Secondary | ICD-10-CM | POA: Diagnosis not present

## 2017-11-11 DIAGNOSIS — W010XXD Fall on same level from slipping, tripping and stumbling without subsequent striking against object, subsequent encounter: Secondary | ICD-10-CM | POA: Diagnosis not present

## 2017-11-11 DIAGNOSIS — S52531D Colles' fracture of right radius, subsequent encounter for closed fracture with routine healing: Secondary | ICD-10-CM | POA: Diagnosis not present

## 2017-11-11 DIAGNOSIS — G5601 Carpal tunnel syndrome, right upper limb: Secondary | ICD-10-CM | POA: Diagnosis not present

## 2017-11-16 DIAGNOSIS — G5601 Carpal tunnel syndrome, right upper limb: Secondary | ICD-10-CM | POA: Diagnosis not present

## 2017-11-16 DIAGNOSIS — S52531D Colles' fracture of right radius, subsequent encounter for closed fracture with routine healing: Secondary | ICD-10-CM | POA: Diagnosis not present

## 2017-11-16 DIAGNOSIS — Z4789 Encounter for other orthopedic aftercare: Secondary | ICD-10-CM | POA: Diagnosis not present

## 2017-11-16 DIAGNOSIS — M6281 Muscle weakness (generalized): Secondary | ICD-10-CM | POA: Diagnosis not present

## 2017-11-16 DIAGNOSIS — G5621 Lesion of ulnar nerve, right upper limb: Secondary | ICD-10-CM | POA: Diagnosis not present

## 2017-11-16 DIAGNOSIS — R278 Other lack of coordination: Secondary | ICD-10-CM | POA: Diagnosis not present

## 2017-11-16 DIAGNOSIS — S6291XB Unspecified fracture of right wrist and hand, initial encounter for open fracture: Secondary | ICD-10-CM | POA: Diagnosis not present

## 2017-11-16 DIAGNOSIS — R262 Difficulty in walking, not elsewhere classified: Secondary | ICD-10-CM | POA: Diagnosis not present

## 2017-11-16 DIAGNOSIS — W010XXD Fall on same level from slipping, tripping and stumbling without subsequent striking against object, subsequent encounter: Secondary | ICD-10-CM | POA: Diagnosis not present

## 2017-11-18 DIAGNOSIS — W010XXD Fall on same level from slipping, tripping and stumbling without subsequent striking against object, subsequent encounter: Secondary | ICD-10-CM | POA: Diagnosis not present

## 2017-11-18 DIAGNOSIS — R278 Other lack of coordination: Secondary | ICD-10-CM | POA: Diagnosis not present

## 2017-11-18 DIAGNOSIS — M6281 Muscle weakness (generalized): Secondary | ICD-10-CM | POA: Diagnosis not present

## 2017-11-18 DIAGNOSIS — G5601 Carpal tunnel syndrome, right upper limb: Secondary | ICD-10-CM | POA: Diagnosis not present

## 2017-11-18 DIAGNOSIS — Z4789 Encounter for other orthopedic aftercare: Secondary | ICD-10-CM | POA: Diagnosis not present

## 2017-11-18 DIAGNOSIS — S52531D Colles' fracture of right radius, subsequent encounter for closed fracture with routine healing: Secondary | ICD-10-CM | POA: Diagnosis not present

## 2017-11-18 DIAGNOSIS — G5621 Lesion of ulnar nerve, right upper limb: Secondary | ICD-10-CM | POA: Diagnosis not present

## 2017-11-18 DIAGNOSIS — R262 Difficulty in walking, not elsewhere classified: Secondary | ICD-10-CM | POA: Diagnosis not present

## 2017-11-18 DIAGNOSIS — S6291XB Unspecified fracture of right wrist and hand, initial encounter for open fracture: Secondary | ICD-10-CM | POA: Diagnosis not present

## 2017-11-23 DIAGNOSIS — R419 Unspecified symptoms and signs involving cognitive functions and awareness: Secondary | ICD-10-CM | POA: Diagnosis not present

## 2017-11-25 DIAGNOSIS — S6291XB Unspecified fracture of right wrist and hand, initial encounter for open fracture: Secondary | ICD-10-CM | POA: Diagnosis not present

## 2017-11-25 DIAGNOSIS — S52531D Colles' fracture of right radius, subsequent encounter for closed fracture with routine healing: Secondary | ICD-10-CM | POA: Diagnosis not present

## 2017-11-25 DIAGNOSIS — R278 Other lack of coordination: Secondary | ICD-10-CM | POA: Diagnosis not present

## 2017-11-25 DIAGNOSIS — Z4789 Encounter for other orthopedic aftercare: Secondary | ICD-10-CM | POA: Diagnosis not present

## 2017-11-25 DIAGNOSIS — M6281 Muscle weakness (generalized): Secondary | ICD-10-CM | POA: Diagnosis not present

## 2017-11-25 DIAGNOSIS — G5601 Carpal tunnel syndrome, right upper limb: Secondary | ICD-10-CM | POA: Diagnosis not present

## 2017-11-25 DIAGNOSIS — R262 Difficulty in walking, not elsewhere classified: Secondary | ICD-10-CM | POA: Diagnosis not present

## 2017-11-25 DIAGNOSIS — W010XXD Fall on same level from slipping, tripping and stumbling without subsequent striking against object, subsequent encounter: Secondary | ICD-10-CM | POA: Diagnosis not present

## 2017-11-25 DIAGNOSIS — G5621 Lesion of ulnar nerve, right upper limb: Secondary | ICD-10-CM | POA: Diagnosis not present

## 2017-12-02 DIAGNOSIS — J329 Chronic sinusitis, unspecified: Secondary | ICD-10-CM | POA: Diagnosis not present

## 2017-12-02 DIAGNOSIS — G3184 Mild cognitive impairment, so stated: Secondary | ICD-10-CM | POA: Diagnosis not present

## 2017-12-02 DIAGNOSIS — R419 Unspecified symptoms and signs involving cognitive functions and awareness: Secondary | ICD-10-CM | POA: Diagnosis not present

## 2017-12-08 ENCOUNTER — Telehealth: Payer: Self-pay | Admitting: *Deleted

## 2017-12-08 NOTE — Telephone Encounter (Signed)
Patient called asking for a recommendation to an ENT in [place] from [place]. I told the patient that he is out of the office until next week and she was completely fine with that and said she's happy to wait.

## 2017-12-10 DIAGNOSIS — J324 Chronic pansinusitis: Secondary | ICD-10-CM | POA: Diagnosis not present

## 2017-12-10 DIAGNOSIS — G629 Polyneuropathy, unspecified: Secondary | ICD-10-CM | POA: Diagnosis not present

## 2017-12-10 NOTE — Telephone Encounter (Signed)
lmom that Dr. Monica Becton and Dr. Hassell Done are the 2 great ENT Dr.'s in [place].

## 2017-12-24 DIAGNOSIS — H2511 Age-related nuclear cataract, right eye: Secondary | ICD-10-CM | POA: Diagnosis not present

## 2017-12-24 DIAGNOSIS — H04123 Dry eye syndrome of bilateral lacrimal glands: Secondary | ICD-10-CM | POA: Diagnosis not present

## 2017-12-24 DIAGNOSIS — H2512 Age-related nuclear cataract, left eye: Secondary | ICD-10-CM | POA: Diagnosis not present

## 2018-01-07 DIAGNOSIS — H2511 Age-related nuclear cataract, right eye: Secondary | ICD-10-CM | POA: Diagnosis not present

## 2018-02-09 DIAGNOSIS — J111 Influenza due to unidentified influenza virus with other respiratory manifestations: Secondary | ICD-10-CM | POA: Diagnosis not present

## 2018-02-09 DIAGNOSIS — R509 Fever, unspecified: Secondary | ICD-10-CM | POA: Diagnosis not present

## 2018-02-09 DIAGNOSIS — J189 Pneumonia, unspecified organism: Secondary | ICD-10-CM | POA: Diagnosis not present

## 2018-04-02 ENCOUNTER — Other Ambulatory Visit: Payer: Self-pay | Admitting: Pediatrics

## 2018-04-11 ENCOUNTER — Other Ambulatory Visit: Payer: Self-pay | Admitting: Allergy

## 2018-04-11 MED ORDER — MOMETASONE FURO-FORMOTEROL FUM 200-5 MCG/ACT IN AERO: 2.0000 | INHALATION_SPRAY | Freq: Two times a day (BID) | RESPIRATORY_TRACT | 0 refills | Status: DC

## 2018-04-12 ENCOUNTER — Ambulatory Visit: Payer: PPO | Admitting: Pediatrics

## 2018-04-12 ENCOUNTER — Encounter: Payer: Self-pay | Admitting: Pediatrics

## 2018-04-12 VITALS — BP 118/72 | HR 88 | Temp 98.0°F | Resp 20 | Ht 61.0 in | Wt 135.4 lb

## 2018-04-12 DIAGNOSIS — J3089 Other allergic rhinitis: Secondary | ICD-10-CM

## 2018-04-12 DIAGNOSIS — J4551 Severe persistent asthma with (acute) exacerbation: Secondary | ICD-10-CM

## 2018-04-12 DIAGNOSIS — K219 Gastro-esophageal reflux disease without esophagitis: Secondary | ICD-10-CM

## 2018-04-12 MED ORDER — MONTELUKAST SODIUM 10 MG PO TABS: 10.0000 mg | ORAL_TABLET | Freq: Every day | ORAL | 5 refills | Status: AC

## 2018-04-12 MED ORDER — MOMETASONE FURO-FORMOTEROL FUM 200-5 MCG/ACT IN AERO: INHALATION_SPRAY | RESPIRATORY_TRACT | 5 refills | Status: AC

## 2018-04-12 MED ORDER — ALBUTEROL SULFATE HFA 108 (90 BASE) MCG/ACT IN AERS: 2.0000 | INHALATION_SPRAY | RESPIRATORY_TRACT | 2 refills | Status: AC | PRN

## 2018-04-12 MED ORDER — ALBUTEROL SULFATE (2.5 MG/3ML) 0.083% IN NEBU: 2.5000 mg | INHALATION_SOLUTION | RESPIRATORY_TRACT | 2 refills | Status: AC | PRN

## 2018-04-12 NOTE — Progress Notes (Signed)
[place] [address] Dept: [phone]  FOLLOW UP NOTE  Patient ID: Whitney Wallace, female    DOB: [birth year]  Age: 62 y.o. MRN: [id number] Date of Office Visit: 04/12/2018  Assessment  Chief Complaint: Wheezing (x 3 weeks); Cough; and Nasal Congestion  HPI Whitney Wallace presents for evaluation of coughing and wheezing for 2 or 3 weeks. She had double pneumonia in April of this year, and was on prednisone for 10 days. She has not had a follow-up chest x-ray. She has mild nasal congestion.Her last visit with [place] was in February 2018 . She has been on Dulera 200-2 puffs twice a day, montelukast 10 mg once a day,  Ventolin 2 puffs every 4 hours if needed , albuterol 0.083% one unit dose every 4 hours if needed ,and loratadine 10 mg once a day and Nasacort 2 sprays per nostril once a day.  Other current medications are outlined in the chart   Drug Allergies:  Allergies  Allergen Reactions  . Cyclobenzaprine Hcl Other (See Comments)    Ringing in ears  . Flexeril [Cyclobenzaprine]     Physical Exam: BP 118/72 (BP Location: Left Arm, Patient Position: Sitting, Cuff Size: Normal)   Pulse 88   Temp 98 F (36.7 C) (Oral)   Resp 20   Ht 5\' 1"  (1.549 m)   Wt 135 lb 6.4 oz (61.4 kg)   SpO2 96%   BMI 25.58 kg/m    Physical Exam  Constitutional: She is oriented to person, place, and time. She appears well-developed and well-nourished.  HENT:  Eyes normal. Ears normal. Nose normal. Pharynx normal.  Neck: Neck supple.  Cardiovascular:  S 1 S2 normal no murmurs  Pulmonary/Chest:  Increased anteroposterior diameter Clear to percussion and auscultation except for mild wheezing in both lungs  Lymphadenopathy:    She has no cervical adenopathy.  Neurological: She is alert and oriented to person, place, and time.  Psychiatric: She has a normal mood and affect. Her behavior is normal. Judgment and thought content normal.  Vitals reviewed.   Diagnostics:  FVC  2.29 L FEV1 1.69 L. Predicted FVC 2.95 L predicted FEV1 2.28 L. After albuterol by nebulization FVC 2.34 L FEV1 1.79 L-this shows a mild reduction in the forced vital capacity and FEV1 was mild improvement after albuterol  Assessment and Plan: 1. Severe persistent asthma with acute exacerbation   2. Gastroesophageal reflux disease without esophagitis   3. Other allergic rhinitis     Meds ordered this encounter  Medications  . montelukast (SINGULAIR) 10 MG tablet    Sig: Take 1 tablet (10 mg total) by mouth at bedtime.    Dispense:  30 tablet    Refill:  5  . mometasone-formoterol (DULERA) 200-5 MCG/ACT AERO    Sig: Two puffs every 12 hours to prevent cough or wheeze. Rinse, gargle and spit after use.    Dispense:  13 g    Refill:  5  . albuterol (PROVENTIL HFA;VENTOLIN HFA) 108 (90 Base) MCG/ACT inhaler    Sig: Inhale 2 puffs into the lungs every 4 (four) hours as needed for wheezing or shortness of breath.    Dispense:  1 Inhaler    Refill:  2    Place on hold, patient will call.  Marland Kitchen albuterol (PROVENTIL) (2.5 MG/3ML) 0.083% nebulizer solution    Sig: Take 3 mLs (2.5 mg total) by nebulization every 4 (four) hours as needed for wheezing or shortness of breath.  Dispense:  75 mL    Refill:  2    Patient Instructions  Claritin 10 mg once a day for runny nose Nasacort  2 sprays per nostril once a day if needed for stuffy nose Montelukast  10 mg once a day to prevent coughing or wheezing Dulera 200- 2 puffs every 12 hours to prevent coughing or wheezing Ventolin 2 puffs every 4 hours if needed for wheezing or coughing spells or instead albuterol 0.083% one unit dose every 4 hours if needed Prednisone 20 mg twice a day for 3 days, 20 mg on the fourth day, 10 mg on the fifth day to bring your allergic symptoms and asthma under control Continue your other medications Call us if you are not doing well on this treatment plan We will do a chest x-ray in 6 weeks to make sure that the  pneumonia that you  had an April has cleared   Return in about 6 weeks (around 05/24/2018).    Thank you for the opportunity to care for this patient.  Please do not hesitate to contact me with questions.  Penne Lash, M.D.  [facility] [address] [place], [place] [address] [phone]

## 2018-04-12 NOTE — Patient Instructions (Addendum)
Claritin 10 mg once a day for runny nose Nasacort  2 sprays per nostril once a day if needed for stuffy nose Montelukast  10 mg once a day to prevent coughing or wheezing Dulera 200- 2 puffs every 12 hours to prevent coughing or wheezing Ventolin 2 puffs every 4 hours if needed for wheezing or coughing spells or instead albuterol 0.083% one unit dose every 4 hours if needed Prednisone 20 mg twice a day for 3 days, 20 mg on the fourth day, 10 mg on the fifth day to bring your allergic symptoms and asthma under control Continue your other medications Call us if you are not doing well on this treatment plan We will do a chest x-ray in 6 weeks to make sure that the pneumonia that you  had an April has cleared

## 2018-04-20 DIAGNOSIS — R4189 Other symptoms and signs involving cognitive functions and awareness: Secondary | ICD-10-CM | POA: Diagnosis not present

## 2018-04-22 DIAGNOSIS — F419 Anxiety disorder, unspecified: Secondary | ICD-10-CM | POA: Diagnosis not present

## 2018-04-22 DIAGNOSIS — E039 Hypothyroidism, unspecified: Secondary | ICD-10-CM | POA: Diagnosis not present

## 2018-04-22 DIAGNOSIS — N289 Disorder of kidney and ureter, unspecified: Secondary | ICD-10-CM | POA: Insufficient documentation

## 2018-04-22 DIAGNOSIS — G4700 Insomnia, unspecified: Secondary | ICD-10-CM | POA: Diagnosis not present

## 2018-05-02 ENCOUNTER — Encounter: Payer: Self-pay | Admitting: Family Medicine

## 2018-05-02 ENCOUNTER — Telehealth: Payer: Self-pay | Admitting: Allergy

## 2018-05-02 ENCOUNTER — Ambulatory Visit: Payer: PPO | Admitting: Family Medicine

## 2018-05-02 VITALS — BP 118/70 | HR 80 | Temp 97.6°F | Resp 16

## 2018-05-02 DIAGNOSIS — J4540 Moderate persistent asthma, uncomplicated: Secondary | ICD-10-CM | POA: Diagnosis not present

## 2018-05-02 DIAGNOSIS — J0100 Acute maxillary sinusitis, unspecified: Secondary | ICD-10-CM | POA: Insufficient documentation

## 2018-05-02 DIAGNOSIS — J3089 Other allergic rhinitis: Secondary | ICD-10-CM | POA: Diagnosis not present

## 2018-05-02 DIAGNOSIS — K219 Gastro-esophageal reflux disease without esophagitis: Secondary | ICD-10-CM | POA: Diagnosis not present

## 2018-05-02 MED ORDER — AMOXICILLIN-POT CLAVULANATE 875-125 MG PO TABS: 1.0000 | ORAL_TABLET | Freq: Two times a day (BID) | ORAL | 0 refills | Status: AC

## 2018-05-02 NOTE — Telephone Encounter (Signed)
DONE

## 2018-05-02 NOTE — Progress Notes (Signed)
[place] [address] Dept: [phone]  FOLLOW UP NOTE  Patient ID: Whitney Wallace, female    DOB: [birth year]  Age: 62 y.o. MRN: [id number] Date of Office Visit: 05/02/2018  Assessment  Chief Complaint: Nasal Congestion (green sinus drainage)  HPI Whitney Wallace is a 62 year old female who presents to the clinic for a follow up visit. She was last seen in this clinic on 04/12/2018 by Dr. Shaune Leeks for evaluation of coughing and wheezing for 2-3 weeks which was preceded by double pneumonia in April 2019. She required prednisone for resolution of symptoms.   At today's visit, she reports that she has been blowing "spring leaf green" chunks from her nose. She reports some facial pressure, slight pain in her teeth, and pressure backing up into her ears. She denies fever and sick contacts. She reports a productive cough with dark yellow sputum for the last week. She reports using nasal saline rinses daily.  Asthma is reported as moderately well controlled with some shortness of breath, wheezing, and coughing with activity. She is currently using Dulera 200- 2 puffs twice a day, montelukast 10 mg once a day, and using her albuterol inhaler 2-7 times a week. She reports her asthma has greatly improved since starting [facility] on a regular schedule.   Her current medications are listed in the chart.    Drug Allergies:  Allergies  Allergen Reactions  . Cyclobenzaprine Hcl Other (See Comments)    Ringing in ears  . Flexeril [Cyclobenzaprine]     Physical Exam: BP 118/70 (BP Location: Left Arm, Patient Position: Sitting, Cuff Size: Normal)   Pulse 80   Temp 97.6 F (36.4 C) (Oral)   Resp 16   SpO2 97%    Physical Exam  Constitutional: She is oriented to person, place, and time. She appears well-developed and well-nourished.  HENT:  Head: Normocephalic.  Right Ear: External ear normal.  Left Ear: External ear normal.  Nasal septal deviation noted. Bilateral nates  erythematous and edematous with no nasal drainage noted. Pharynx slightly erythematous with no exudate noted. Ears with bilateral TM's retracted. Eyes normal.   Eyes: Conjunctivae are normal.  Neck: Normal range of motion.  Cardiovascular: Normal rate, regular rhythm and normal heart sounds.  No murmur noted  Pulmonary/Chest: Effort normal and breath sounds normal.  Lungs clear to auscultation  Musculoskeletal: Normal range of motion.  Neurological: She is alert and oriented to person, place, and time.  Skin: Skin is warm and dry.  Psychiatric: She has a normal mood and affect. Her behavior is normal. Judgment and thought content normal.    Diagnostics: FVC 3.08, FEV1 2.54. Predicted FVC 2.95, predicted FEV1 2.28. Spirometry is within the normal range.   Assessment and Plan: 1. Acute maxillary sinusitis, recurrence not specified   2. Moderate persistent asthma without complication   3. Gastroesophageal reflux disease without esophagitis   4. Other allergic rhinitis     Meds ordered this encounter  Medications  . amoxicillin-clavulanate (AUGMENTIN) 875-125 MG tablet    Sig: Take 1 tablet by mouth 2 (two) times daily for 10 days.    Dispense:  20 tablet    Refill:  0    Patient Instructions  Claritin 10 mg once a day for runny nose Nasacort  2 sprays per nostril once a day if needed for stuffy nose Continue nasal saline rinses. Montelukast  10 mg once a day to prevent coughing or wheezing Dulera 200- 2 puffs every 12 hours  to prevent coughing or wheezing Ventolin 2 puffs every 4 hours if needed for wheezing or coughing spells or instead albuterol 0.083% one unit dose every 4 hours if needed Augmentin 875 mg twice a day for 10 days for sinus infection Refer to [facility] ENT for further evaluation Continue your other medications Call us if you are not doing well on this treatment plan We will do a chest x-ray in 3 more weeks to make sure that the pneumonia that you  had an  April has cleared  Follow up in 2 months or sooner if needed   Return in about 2 months (around 07/02/2018), or if symptoms worsen or fail to improve.   Thank you for the opportunity to care for this patient.  Please do not hesitate to contact me with questions.  Gareth Morgan, FNP [facility]  I have provided oversight concerning Gareth Morgan' evaluation and treatment of this patient's health issues addressed during today's encounter. I agree with the assessment and therapeutic plan as outlined in the note.   Thank you for the opportunity to care for this patient.  Please do not hesitate to contact me with questions.  Penne Lash, M.D.  [facility] [address] [place], [place] [address] [phone]

## 2018-05-02 NOTE — Patient Instructions (Addendum)
Claritin 10 mg once a day for runny nose Nasacort  2 sprays per nostril once a day if needed for stuffy nose Continue nasal saline rinses. Montelukast  10 mg once a day to prevent coughing or wheezing Dulera 200- 2 puffs every 12 hours to prevent coughing or wheezing Ventolin 2 puffs every 4 hours if needed for wheezing or coughing spells or instead albuterol 0.083% one unit dose every 4 hours if needed Augmentin 875 mg twice a day for 10 days for sinus infection Refer to [facility] for further evaluation Continue your other medications Call us if you are not doing well on this treatment plan We will do a chest x-ray in 3 more weeks to make sure that the pneumonia that you  had an April has cleared  Follow up in 2 months or sooner if needed

## 2018-05-21 DIAGNOSIS — B373 Candidiasis of vulva and vagina: Secondary | ICD-10-CM | POA: Diagnosis not present

## 2018-05-23 ENCOUNTER — Ambulatory Visit: Payer: PPO | Admitting: Family Medicine

## 2018-05-23 ENCOUNTER — Encounter: Payer: Self-pay | Admitting: Family Medicine

## 2018-05-23 VITALS — BP 118/60 | HR 72 | Resp 16

## 2018-05-23 DIAGNOSIS — R430 Anosmia: Secondary | ICD-10-CM | POA: Diagnosis not present

## 2018-05-23 DIAGNOSIS — J4540 Moderate persistent asthma, uncomplicated: Secondary | ICD-10-CM

## 2018-05-23 DIAGNOSIS — J3089 Other allergic rhinitis: Secondary | ICD-10-CM

## 2018-05-23 DIAGNOSIS — K219 Gastro-esophageal reflux disease without esophagitis: Secondary | ICD-10-CM

## 2018-05-23 MED ORDER — LORATADINE 10 MG PO TABS: 10.0000 mg | ORAL_TABLET | Freq: Every day | ORAL | 5 refills | Status: AC

## 2018-05-23 MED ORDER — RANITIDINE HCL 150 MG PO TABS: 150.0000 mg | ORAL_TABLET | Freq: Two times a day (BID) | ORAL | 5 refills | Status: AC

## 2018-05-23 MED ORDER — TRIAMCINOLONE ACETONIDE 55 MCG/ACT NA AERO: 2.0000 | INHALATION_SPRAY | Freq: Every day | NASAL | 5 refills | Status: AC | PRN

## 2018-05-23 NOTE — Progress Notes (Signed)
[place] [address] Dept: [phone]  FOLLOW UP NOTE  Patient ID: Whitney Wallace, female    DOB: 1956-02-06  Age: 62 y.o. MRN: [id number] Date of Office Visit: 05/23/2018  Assessment  Chief Complaint: Asthma; Nasal Congestion; and Gastroesophageal Reflux  HPI Whitney Wallace is a 62 year old female who presents to the clinic for a follow up visit. She was last seen in this clinic on 05/02/2018 by Dr. Shaune Leeks for evaluation of acute sinusitis requiring an antibiotic for resolution of symptoms. At that time, she continued Dulera 200- 2 puffs every 12 hours, montelukast once a day and albuterol as needed. She continued on Nasocort and Claritin for control of allergic rhinitis.   At today's visit, she reports her asthma has been well controlled with occasional symptoms of wheezing and dry cough. She reports her breathing as much better now that she has been using Dulera 200- 2 puffs every 12 hours. She also continues montelukast 10 mg once a day and has used her albuterol 1-2 times over the last months.   Allergic rhinitis is reported as not well controlled with symptoms including nasal congestion, thick post nasal drip, and continued loss of smell and taste. She is currently using Nasonex daily, Claritin daily, and occasional nasal rinses. Conjunctivitis is reported as well controlled with daily over the counter eye drops.  Reflux is reported as not well controlled with frequent breakthrough heartburn despite daily esomeprazole. She reports the heartburn is relieved by Tums or Alka-Seltzer. She reports that last night she felt a heaviness in her chest that was completely resolved 10 minutes after taking an over the counter Alka-Seltzer.  Her current medications are listed in the chart.   Drug Allergies:  Allergies  Allergen Reactions  . Cyclobenzaprine Hcl Other (See Comments)    Ringing in ears    Physical Exam: BP 118/60   Pulse 72   Resp 16    Physical  Exam  Constitutional: She is oriented to person, place, and time. She appears well-developed and well-nourished.  HENT:  Head: Normocephalic.  Right Ear: External ear normal.  Left Ear: External ear normal.  Mouth/Throat: Oropharynx is clear and moist.  Bilateral nares erythematous and edematous with clear nasal drainage.  Nasal septal deviation noted.  Pharynx normal.  Ears normal.  Eyes normal.  Eyes: Conjunctivae are normal.  Neck: Normal range of motion. Neck supple.  Cardiovascular: Normal rate, regular rhythm and normal heart sounds.  No murmur noted  Pulmonary/Chest: Effort normal and breath sounds normal.  Lungs clear to auscultation  Musculoskeletal: Normal range of motion.  Neurological: She is alert and oriented to person, place, and time.  Skin: Skin is warm and dry.  Psychiatric: She has a normal mood and affect. Her behavior is normal. Judgment and thought content normal.  Vitals reviewed.   Diagnostics: FVC 2.87, FEV1 2.15.  Predicted FVC 2.95, predicted FEV1 2.28.  Spirometry is within the normal range.  Assessment and Plan: 1. Moderate persistent asthma without complication   2. Other allergic rhinitis   3. Gastroesophageal reflux disease without esophagitis   4. Anosmia     Meds ordered this encounter  Medications  . triamcinolone (NASACORT ALLERGY 24HR) 55 MCG/ACT AERO nasal inhaler    Sig: Place 2 sprays into the nose daily as needed (for nasal congestion or drainage.).    Dispense:  1 Inhaler    Refill:  5  . loratadine (CLARITIN) 10 MG tablet    Sig: Take 1  tablet (10 mg total) by mouth at bedtime.    Dispense:  30 tablet    Refill:  5  . ranitidine (ZANTAC) 150 MG tablet    Sig: Take 1 tablet (150 mg total) by mouth 2 (two) times daily.    Dispense:  60 tablet    Refill:  5    Patient Instructions  Claritin 10 mg once a day for runny nose Nasacort  2 sprays per nostril once a day if needed for stuffy nose Continue nasal saline rinses. Add  mucinex [phone] mg twice a day as needed to thin mucus drainage Montelukast  10 mg once a day to prevent coughing or wheezing Dulera 200- 2 puffs every 12 hours to prevent coughing or wheezing Ventolin 2 puffs every 4 hours if needed for wheezing or coughing spells or instead albuterol 0.083% one unit dose every 4 hours if needed Ranitidine 150 mg twice a day to help with reflux. Take this in addition to esomeprazole.  Refer to [facility] ENT for further evaluation Follow up chest xray for pneumonia in April with your primary care provider  Continue your other medications as listed in the chart  Call us if you are not doing well on this treatment plan  Follow up in 3 months or sooner if needed   Return in about 3 months (around 08/23/2018), or if symptoms worsen or fail to improve.   Thank you for the opportunity to care for this patient.  Please do not hesitate to contact me with questions.  Gareth Morgan, FNP [facility]  I have provided oversight concerning Gareth Morgan' evaluation and treatment of this patient's health issues addressed during today's encounter. I agree with the assessment and therapeutic plan as outlined in the note.   Thank you for the opportunity to care for this patient.  Please do not hesitate to contact me with questions.  Penne Lash, M.D.  [facility] [address] [place], [place] [address] [phone]

## 2018-05-23 NOTE — Patient Instructions (Addendum)
Claritin 10 mg once a day for runny nose Nasacort  2 sprays per nostril once a day if needed for stuffy nose Continue nasal saline rinses. Add mucinex [phone] mg twice a day as needed to thin mucus drainage Montelukast  10 mg once a day to prevent coughing or wheezing Dulera 200- 2 puffs every 12 hours to prevent coughing or wheezing Ventolin 2 puffs every 4 hours if needed for wheezing or coughing spells or instead albuterol 0.083% one unit dose every 4 hours if needed Ranitidine 150 mg twice a day to help with reflux. Take this in addition to esomeprazole.  Refer to [facility] ENT for further evaluation Follow up chest xray for pneumonia in April with your primary care provider  Continue your other medications as listed in the chart  Call us if you are not doing well on this treatment plan  Follow up in 3 months or sooner if needed

## 2018-05-26 ENCOUNTER — Telehealth: Payer: Self-pay

## 2018-05-26 NOTE — Telephone Encounter (Signed)
Pt called in and said that she has taken the 2nd diflucan and still have the yeast infection. I sit okay to send in another one?

## 2018-05-26 NOTE — Telephone Encounter (Signed)
We did not prescribe this medication for her. Please have her follow up with her PCP. If she has already taken 2 doses with no improvement they will likely want to see her back in their office. Thank you

## 2018-05-27 DIAGNOSIS — N760 Acute vaginitis: Secondary | ICD-10-CM | POA: Diagnosis not present

## 2018-05-27 NOTE — Telephone Encounter (Signed)
Noted! Thank you

## 2018-05-27 NOTE — Telephone Encounter (Signed)
Pt. Wanting another round of diflucan 150mg  which we didn't prescribe. We had prescribed augmentin 875 mg for 10 days. Pt. States 3 days after finishing the antibiotic she started having vaginal itching in which she went to the urgent care in [place] and was prescribed diflucan 150 mg. The pt. Took both diflucan 150mg  and has used the metronidazole cream and she is still having severe vaginal itching. Gareth Morgan said it would be best for her to follow up with the Dr.'s at urgent care that had prescribed the medication and there maybe something else that maybe going on.

## 2018-05-27 NOTE — Telephone Encounter (Signed)
Whitney Wallace took care of calling the pt and advising her of go back to urgent care about getting more diflucan as we did not prescribe it

## 2018-05-30 ENCOUNTER — Telehealth: Payer: Self-pay

## 2018-05-30 NOTE — Telephone Encounter (Signed)
Okay great, quick question is there a reasoning she needs to tell her PCP why she is needing the Chest Xray

## 2018-05-30 NOTE — Telephone Encounter (Signed)
Dr. Shaune Leeks wanted to have a repeat chest xray 4-6 weeks after her pneumonia for comparison. Thank you

## 2018-05-30 NOTE — Telephone Encounter (Signed)
Patient is also wondering where should she get the chest xray from?  Please Advise

## 2018-05-30 NOTE — Telephone Encounter (Signed)
She should return to the facility where she got the original chest xray so they can do a comparison study.

## 2018-05-30 NOTE — Telephone Encounter (Signed)
Referral has been scheduled for 06/15/2018 at 3:40 with Dr Pat Patrick

## 2018-05-30 NOTE — Telephone Encounter (Signed)
Left a voicemail for the patient to give me a call back. 

## 2018-05-30 NOTE — Telephone Encounter (Signed)
-----   Message from Horris Latino, [place] sent at 05/23/2018  1:35 PM EDT ----- Regarding: ent referral Please refer patient to [facility]NT for anosmia. Thank you.

## 2018-06-28 DIAGNOSIS — J324 Chronic pansinusitis: Secondary | ICD-10-CM | POA: Diagnosis not present

## 2018-06-28 DIAGNOSIS — J310 Chronic rhinitis: Secondary | ICD-10-CM | POA: Diagnosis not present

## 2018-06-28 DIAGNOSIS — R430 Anosmia: Secondary | ICD-10-CM | POA: Diagnosis not present

## 2018-08-03 DIAGNOSIS — J209 Acute bronchitis, unspecified: Secondary | ICD-10-CM | POA: Diagnosis not present

## 2018-08-03 DIAGNOSIS — J45909 Unspecified asthma, uncomplicated: Secondary | ICD-10-CM | POA: Diagnosis not present

## 2018-08-26 DIAGNOSIS — J189 Pneumonia, unspecified organism: Secondary | ICD-10-CM | POA: Diagnosis not present

## 2018-09-05 DIAGNOSIS — J189 Pneumonia, unspecified organism: Secondary | ICD-10-CM | POA: Diagnosis not present

## 2018-09-05 DIAGNOSIS — R0602 Shortness of breath: Secondary | ICD-10-CM | POA: Diagnosis not present

## 2018-09-05 DIAGNOSIS — J4541 Moderate persistent asthma with (acute) exacerbation: Secondary | ICD-10-CM | POA: Diagnosis not present

## 2018-09-05 DIAGNOSIS — R05 Cough: Secondary | ICD-10-CM | POA: Diagnosis not present

## 2018-09-13 DIAGNOSIS — L570 Actinic keratosis: Secondary | ICD-10-CM | POA: Diagnosis not present

## 2018-09-13 DIAGNOSIS — L821 Other seborrheic keratosis: Secondary | ICD-10-CM | POA: Diagnosis not present

## 2018-09-13 DIAGNOSIS — L818 Other specified disorders of pigmentation: Secondary | ICD-10-CM | POA: Diagnosis not present

## 2018-09-13 DIAGNOSIS — L814 Other melanin hyperpigmentation: Secondary | ICD-10-CM | POA: Diagnosis not present

## 2018-09-26 DIAGNOSIS — J323 Chronic sphenoidal sinusitis: Secondary | ICD-10-CM | POA: Diagnosis not present

## 2018-09-26 DIAGNOSIS — J329 Chronic sinusitis, unspecified: Secondary | ICD-10-CM | POA: Diagnosis not present

## 2018-10-17 DIAGNOSIS — Z87891 Personal history of nicotine dependence: Secondary | ICD-10-CM | POA: Diagnosis not present

## 2018-10-17 DIAGNOSIS — J0130 Acute sphenoidal sinusitis, unspecified: Secondary | ICD-10-CM | POA: Diagnosis not present

## 2018-10-17 DIAGNOSIS — J0100 Acute maxillary sinusitis, unspecified: Secondary | ICD-10-CM | POA: Diagnosis not present

## 2018-10-17 DIAGNOSIS — Z7289 Other problems related to lifestyle: Secondary | ICD-10-CM | POA: Diagnosis not present

## 2018-10-19 DIAGNOSIS — J0100 Acute maxillary sinusitis, unspecified: Secondary | ICD-10-CM | POA: Diagnosis not present

## 2018-10-25 DIAGNOSIS — Z0000 Encounter for general adult medical examination without abnormal findings: Secondary | ICD-10-CM | POA: Diagnosis not present

## 2018-10-25 DIAGNOSIS — Z1231 Encounter for screening mammogram for malignant neoplasm of breast: Secondary | ICD-10-CM | POA: Diagnosis not present

## 2018-10-25 DIAGNOSIS — N289 Disorder of kidney and ureter, unspecified: Secondary | ICD-10-CM | POA: Diagnosis not present

## 2018-10-25 DIAGNOSIS — Z23 Encounter for immunization: Secondary | ICD-10-CM | POA: Diagnosis not present

## 2018-10-25 DIAGNOSIS — G4700 Insomnia, unspecified: Secondary | ICD-10-CM | POA: Diagnosis not present

## 2018-10-25 DIAGNOSIS — E039 Hypothyroidism, unspecified: Secondary | ICD-10-CM | POA: Diagnosis not present

## 2018-11-15 DIAGNOSIS — Z1231 Encounter for screening mammogram for malignant neoplasm of breast: Secondary | ICD-10-CM | POA: Diagnosis not present

## 2018-11-15 DIAGNOSIS — J323 Chronic sphenoidal sinusitis: Secondary | ICD-10-CM | POA: Diagnosis not present

## 2018-11-15 DIAGNOSIS — M810 Age-related osteoporosis without current pathological fracture: Secondary | ICD-10-CM | POA: Diagnosis not present

## 2018-11-15 DIAGNOSIS — J310 Chronic rhinitis: Secondary | ICD-10-CM | POA: Diagnosis not present

## 2018-11-15 DIAGNOSIS — Z780 Asymptomatic menopausal state: Secondary | ICD-10-CM | POA: Diagnosis not present

## 2019-04-04 DIAGNOSIS — H40013 Open angle with borderline findings, low risk, bilateral: Secondary | ICD-10-CM | POA: Diagnosis not present

## 2019-04-04 DIAGNOSIS — H04123 Dry eye syndrome of bilateral lacrimal glands: Secondary | ICD-10-CM | POA: Diagnosis not present

## 2019-04-06 DIAGNOSIS — H16223 Keratoconjunctivitis sicca, not specified as Sjogren's, bilateral: Secondary | ICD-10-CM | POA: Diagnosis not present

## 2019-09-08 DIAGNOSIS — J209 Acute bronchitis, unspecified: Secondary | ICD-10-CM | POA: Diagnosis not present

## 2019-09-08 DIAGNOSIS — J45901 Unspecified asthma with (acute) exacerbation: Secondary | ICD-10-CM | POA: Diagnosis not present

## 2019-11-02 DIAGNOSIS — J323 Chronic sphenoidal sinusitis: Secondary | ICD-10-CM | POA: Diagnosis not present

## 2019-12-22 DIAGNOSIS — D72819 Decreased white blood cell count, unspecified: Secondary | ICD-10-CM | POA: Diagnosis not present

## 2019-12-22 DIAGNOSIS — M810 Age-related osteoporosis without current pathological fracture: Secondary | ICD-10-CM | POA: Diagnosis not present

## 2019-12-22 DIAGNOSIS — Z1159 Encounter for screening for other viral diseases: Secondary | ICD-10-CM | POA: Diagnosis not present

## 2019-12-22 DIAGNOSIS — F339 Major depressive disorder, recurrent, unspecified: Secondary | ICD-10-CM | POA: Diagnosis not present

## 2019-12-22 DIAGNOSIS — N289 Disorder of kidney and ureter, unspecified: Secondary | ICD-10-CM | POA: Diagnosis not present

## 2019-12-22 DIAGNOSIS — F411 Generalized anxiety disorder: Secondary | ICD-10-CM | POA: Diagnosis not present

## 2019-12-22 DIAGNOSIS — E039 Hypothyroidism, unspecified: Secondary | ICD-10-CM | POA: Diagnosis not present

## 2019-12-22 DIAGNOSIS — E785 Hyperlipidemia, unspecified: Secondary | ICD-10-CM | POA: Diagnosis not present

## 2020-04-17 DIAGNOSIS — H18413 Arcus senilis, bilateral: Secondary | ICD-10-CM | POA: Diagnosis not present

## 2020-04-17 DIAGNOSIS — H02834 Dermatochalasis of left upper eyelid: Secondary | ICD-10-CM | POA: Diagnosis not present

## 2020-04-17 DIAGNOSIS — H5213 Myopia, bilateral: Secondary | ICD-10-CM | POA: Diagnosis not present

## 2020-04-17 DIAGNOSIS — H16223 Keratoconjunctivitis sicca, not specified as Sjogren's, bilateral: Secondary | ICD-10-CM | POA: Diagnosis not present

## 2020-04-17 DIAGNOSIS — H02831 Dermatochalasis of right upper eyelid: Secondary | ICD-10-CM | POA: Diagnosis not present

## 2020-04-17 DIAGNOSIS — H524 Presbyopia: Secondary | ICD-10-CM | POA: Diagnosis not present

## 2020-04-17 DIAGNOSIS — H40013 Open angle with borderline findings, low risk, bilateral: Secondary | ICD-10-CM | POA: Diagnosis not present

## 2020-04-17 DIAGNOSIS — H11153 Pinguecula, bilateral: Secondary | ICD-10-CM | POA: Diagnosis not present

## 2020-04-17 DIAGNOSIS — H52223 Regular astigmatism, bilateral: Secondary | ICD-10-CM | POA: Diagnosis not present

## 2020-06-20 DIAGNOSIS — R635 Abnormal weight gain: Secondary | ICD-10-CM | POA: Diagnosis not present

## 2020-06-20 DIAGNOSIS — E039 Hypothyroidism, unspecified: Secondary | ICD-10-CM | POA: Diagnosis not present

## 2020-06-20 DIAGNOSIS — L659 Nonscarring hair loss, unspecified: Secondary | ICD-10-CM | POA: Diagnosis not present

## 2020-06-20 DIAGNOSIS — R238 Other skin changes: Secondary | ICD-10-CM | POA: Diagnosis not present

## 2020-06-25 DIAGNOSIS — H02052 Trichiasis without entropian right lower eyelid: Secondary | ICD-10-CM | POA: Diagnosis not present

## 2020-06-25 DIAGNOSIS — H02051 Trichiasis without entropian right upper eyelid: Secondary | ICD-10-CM | POA: Diagnosis not present

## 2020-06-27 DIAGNOSIS — Z1231 Encounter for screening mammogram for malignant neoplasm of breast: Secondary | ICD-10-CM | POA: Diagnosis not present

## 2020-07-19 DIAGNOSIS — J0100 Acute maxillary sinusitis, unspecified: Secondary | ICD-10-CM | POA: Diagnosis not present

## 2020-07-19 DIAGNOSIS — J209 Acute bronchitis, unspecified: Secondary | ICD-10-CM | POA: Diagnosis not present

## 2020-07-19 DIAGNOSIS — Z20828 Contact with and (suspected) exposure to other viral communicable diseases: Secondary | ICD-10-CM | POA: Diagnosis not present

## 2020-10-06 DIAGNOSIS — Z20822 Contact with and (suspected) exposure to covid-19: Secondary | ICD-10-CM | POA: Diagnosis not present

## 2020-10-08 DIAGNOSIS — Z20822 Contact with and (suspected) exposure to covid-19: Secondary | ICD-10-CM | POA: Diagnosis not present

## 2020-10-11 DIAGNOSIS — U071 COVID-19: Secondary | ICD-10-CM | POA: Diagnosis not present

## 2020-12-17 DIAGNOSIS — J329 Chronic sinusitis, unspecified: Secondary | ICD-10-CM | POA: Diagnosis not present

## 2021-01-17 DIAGNOSIS — R051 Acute cough: Secondary | ICD-10-CM | POA: Diagnosis not present

## 2021-01-17 DIAGNOSIS — J0100 Acute maxillary sinusitis, unspecified: Secondary | ICD-10-CM | POA: Diagnosis not present

## 2021-01-17 DIAGNOSIS — Z20828 Contact with and (suspected) exposure to other viral communicable diseases: Secondary | ICD-10-CM | POA: Diagnosis not present

## 2021-04-22 DIAGNOSIS — E039 Hypothyroidism, unspecified: Secondary | ICD-10-CM | POA: Diagnosis not present

## 2021-04-22 DIAGNOSIS — E538 Deficiency of other specified B group vitamins: Secondary | ICD-10-CM | POA: Diagnosis not present

## 2021-04-22 DIAGNOSIS — N289 Disorder of kidney and ureter, unspecified: Secondary | ICD-10-CM | POA: Diagnosis not present

## 2021-04-22 DIAGNOSIS — E785 Hyperlipidemia, unspecified: Secondary | ICD-10-CM | POA: Diagnosis not present

## 2021-04-22 DIAGNOSIS — D72819 Decreased white blood cell count, unspecified: Secondary | ICD-10-CM | POA: Diagnosis not present

## 2021-04-25 DIAGNOSIS — F339 Major depressive disorder, recurrent, unspecified: Secondary | ICD-10-CM | POA: Diagnosis not present

## 2021-04-25 DIAGNOSIS — E039 Hypothyroidism, unspecified: Secondary | ICD-10-CM | POA: Diagnosis not present

## 2021-04-25 DIAGNOSIS — N289 Disorder of kidney and ureter, unspecified: Secondary | ICD-10-CM | POA: Diagnosis not present

## 2021-04-25 DIAGNOSIS — F411 Generalized anxiety disorder: Secondary | ICD-10-CM | POA: Diagnosis not present

## 2021-04-25 DIAGNOSIS — E538 Deficiency of other specified B group vitamins: Secondary | ICD-10-CM | POA: Diagnosis not present

## 2021-05-06 DIAGNOSIS — M545 Low back pain, unspecified: Secondary | ICD-10-CM | POA: Diagnosis not present

## 2021-05-07 DIAGNOSIS — J329 Chronic sinusitis, unspecified: Secondary | ICD-10-CM | POA: Diagnosis not present

## 2021-06-10 DIAGNOSIS — M549 Dorsalgia, unspecified: Secondary | ICD-10-CM | POA: Diagnosis not present

## 2021-06-10 DIAGNOSIS — S335XXA Sprain of ligaments of lumbar spine, initial encounter: Secondary | ICD-10-CM | POA: Diagnosis not present

## 2021-07-07 DIAGNOSIS — H524 Presbyopia: Secondary | ICD-10-CM | POA: Diagnosis not present

## 2021-07-07 DIAGNOSIS — H26492 Other secondary cataract, left eye: Secondary | ICD-10-CM | POA: Diagnosis not present

## 2021-07-07 DIAGNOSIS — Z961 Presence of intraocular lens: Secondary | ICD-10-CM | POA: Diagnosis not present

## 2021-07-07 DIAGNOSIS — H26493 Other secondary cataract, bilateral: Secondary | ICD-10-CM | POA: Diagnosis not present

## 2021-07-08 DIAGNOSIS — H26492 Other secondary cataract, left eye: Secondary | ICD-10-CM | POA: Diagnosis not present

## 2021-07-08 DIAGNOSIS — Z961 Presence of intraocular lens: Secondary | ICD-10-CM | POA: Diagnosis not present

## 2021-07-08 DIAGNOSIS — H26493 Other secondary cataract, bilateral: Secondary | ICD-10-CM | POA: Diagnosis not present

## 2021-07-08 DIAGNOSIS — H18413 Arcus senilis, bilateral: Secondary | ICD-10-CM | POA: Diagnosis not present

## 2021-07-08 DIAGNOSIS — H02831 Dermatochalasis of right upper eyelid: Secondary | ICD-10-CM | POA: Diagnosis not present

## 2021-07-24 DIAGNOSIS — H26491 Other secondary cataract, right eye: Secondary | ICD-10-CM | POA: Diagnosis not present

## 2021-07-31 DIAGNOSIS — H26491 Other secondary cataract, right eye: Secondary | ICD-10-CM | POA: Diagnosis not present

## 2021-07-31 DIAGNOSIS — Z961 Presence of intraocular lens: Secondary | ICD-10-CM | POA: Diagnosis not present

## 2021-07-31 DIAGNOSIS — H524 Presbyopia: Secondary | ICD-10-CM | POA: Diagnosis not present

## 2021-10-17 DIAGNOSIS — J45909 Unspecified asthma, uncomplicated: Secondary | ICD-10-CM | POA: Diagnosis not present

## 2021-10-17 DIAGNOSIS — R0981 Nasal congestion: Secondary | ICD-10-CM | POA: Diagnosis not present

## 2021-10-17 DIAGNOSIS — R051 Acute cough: Secondary | ICD-10-CM | POA: Diagnosis not present

## 2024-08-11 ENCOUNTER — Emergency Department (HOSPITAL_BASED_OUTPATIENT_CLINIC_OR_DEPARTMENT_OTHER)

## 2024-08-11 ENCOUNTER — Other Ambulatory Visit: Payer: Self-pay

## 2024-08-11 ENCOUNTER — Emergency Department (HOSPITAL_BASED_OUTPATIENT_CLINIC_OR_DEPARTMENT_OTHER)
Admission: EM | Admit: 2024-08-11 | Discharge: 2024-08-11 | Disposition: A | Discharge status: Home / Self Care | Attending: Emergency Medicine | Admitting: Emergency Medicine

## 2024-08-11 DIAGNOSIS — M25551 Pain in right hip: Secondary | ICD-10-CM | POA: Diagnosis present

## 2024-08-11 DIAGNOSIS — M7918 Myalgia, other site: Secondary | ICD-10-CM | POA: Insufficient documentation

## 2024-08-11 MED ORDER — KETOROLAC TROMETHAMINE 60 MG/2ML IM SOLN
30.0000 mg | Freq: Once | INTRAMUSCULAR | Status: AC
  Administered 2024-08-11: 30 mg via INTRAMUSCULAR
  Filled 2024-08-11: qty 2

## 2024-08-11 MED ORDER — DEXAMETHASONE SODIUM PHOSPHATE 10 MG/ML IJ SOLN
10.0000 mg | Freq: Once | INTRAMUSCULAR | Status: AC
  Administered 2024-08-11: 10 mg via INTRAMUSCULAR
  Filled 2024-08-11: qty 1

## 2024-08-11 MED ORDER — CELECOXIB 200 MG PO CAPS: 200.0000 mg | ORAL_CAPSULE | Freq: Two times a day (BID) | ORAL | 0 refills | Status: AC

## 2024-08-11 NOTE — ED Provider Notes (Signed)
 [place] [facility] Provider Note   CSN: [id number] Arrival date & time: 08/11/24  1305     Patient presents with: Right hip and buttock pain.  Patient reports that she has been having issues with lower back and hip pain for some time.  She regularly sees a [facility].  She went last Tuesday 9 days ago for chiropractic adjustment and after that was sore and then had severe pain in her right buttock which is only localized to the buttock and at times sharp and breath taking.  It does not radiate down her leg.  She denies saddle anesthesia weakness or numbness.  Pain is located in the right buttock and posterior right hip at the trochanter.  She states that her back pain is improved.  She has been taking alternating ibuprofen and Tylenol  without relief of symptoms.  She has been icing 20 minutes at a time on and off without relief of her symptoms.  She is scheduled for physical therapy next week.   Whitney Wallace is a 68 y.o. female.   HPI     Prior to Admission medications   Medication Sig Start Date End Date Taking? Authorizing Provider  albuterol  (PROVENTIL  HFA;VENTOLIN  HFA) 108 (90 Base) MCG/ACT inhaler Inhale 2 puffs into the lungs every 4 (four) hours as needed for wheezing or shortness of breath. 04/12/18   Asa Aloysius LABOR, MD  albuterol  (PROVENTIL ) (2.5 MG/3ML) 0.083% nebulizer solution Take 3 mLs (2.5 mg total) by nebulization every 4 (four) hours as needed for wheezing or shortness of breath. 04/12/18   Asa Aloysius LABOR, MD  ALPRAZolam (XANAX) 1 MG tablet Take 1 mg by mouth 2 (two) times daily.    [provider]  b complex vitamins capsule Take 1 capsule by mouth daily.    [provider]  buPROPion (WELLBUTRIN XL) 150 MG 24 hr tablet Take 150 mg by mouth daily.    [provider]  CALCIUM CITRATE PO Take 1 tablet by mouth 2 (two) times daily.    [provider]  Cholecalciferol (VITAMIN D PO) Take 1 tablet  by mouth daily.    [provider]  esomeprazole (NEXIUM) 40 MG capsule Take 40 mg by mouth 2 (two) times daily before a meal.    [provider]  furosemide (LASIX) 20 MG tablet Take 20 mg by mouth daily as needed for fluid or edema.    [provider]  gabapentin (NEURONTIN) 300 MG capsule Take 300 mg by mouth at bedtime.    [provider]  ibuprofen (ADVIL,MOTRIN) 200 MG tablet Take by mouth.    [provider]  ketotifen (ZADITOR) 0.025 % ophthalmic solution 1 drop Two (2) times a day.    [provider]  levothyroxine (SYNTHROID, LEVOTHROID) 88 MCG tablet Take 88 mcg by mouth daily before breakfast.    [provider]  loratadine  (CLARITIN ) 10 MG tablet Take 1 tablet (10 mg total) by mouth at bedtime. 05/23/18   Ambs, Arlean CHRISTELLA, FNP  metronidazole (NORITATE) 1 % cream Apply 1 application topically daily.    [provider]  mometasone -formoterol  (DULERA) 200-5 MCG/ACT AERO Two puffs every 12 hours to prevent cough or wheeze. Rinse, gargle and spit after use. 04/12/18   Asa Aloysius LABOR, MD  montelukast  (SINGULAIR ) 10 MG tablet Take 1 tablet (10 mg total) by mouth at bedtime. 04/12/18   Asa Aloysius LABOR, MD  OVER THE COUNTER MEDICATION Magnesium spray for leg cramps once  a week    [provider]  Polyethyl Glycol-Propyl Glycol (SYSTANE OP) Apply to eye.    [provider]  potassium chloride (K-DUR,KLOR-CON) 10 MEQ tablet Take by mouth.    [provider]  Probiotic Product (PROBIOTIC DAILY PO) Take by mouth.    [provider]  ranitidine  (ZANTAC ) 150 MG tablet Take 1 tablet (150 mg total) by mouth 2 (two) times daily. 05/23/18   Cari Arlean HERO, FNP  sertraline (ZOLOFT) 100 MG tablet Take 100 mg by mouth daily.    [provider]  triamcinolone  (NASACORT  ALLERGY 24HR) 55 MCG/ACT AERO nasal inhaler Place 2 sprays into the nose daily as needed (for nasal congestion or drainage.). 05/23/18    Cari Arlean HERO, FNP  vitamin C (ASCORBIC ACID) 500 MG tablet Take 500 mg by mouth daily.    [provider]    Allergies: Cyclobenzaprine hcl    Review of Systems  Updated Vital Signs BP 139/73 (BP Location: Right Arm)   Pulse 82   Temp 97.8 F (36.6 C) (Oral)   Resp 20   Ht 5' 1 (1.549 m)   Wt 61.2 kg   SpO2 98%   BMI 25.51 kg/m   Physical Exam Vitals and nursing note reviewed.  Constitutional:      General: She is not in acute distress.    Appearance: She is well-developed. She is not diaphoretic.  HENT:     Head: Normocephalic and atraumatic.     Right Ear: External ear normal.     Left Ear: External ear normal.     Nose: Nose normal.     Mouth/Throat:     Mouth: Mucous membranes are moist.  Eyes:     General: No scleral icterus.    Conjunctiva/sclera: Conjunctivae normal.  Cardiovascular:     Rate and Rhythm: Normal rate and regular rhythm.     Heart sounds: Normal heart sounds. No murmur heard.    No friction rub. No gallop.  Pulmonary:     Effort: Pulmonary effort is normal. No respiratory distress.     Breath sounds: Normal breath sounds.  Abdominal:     General: Bowel sounds are normal. There is no distension.     Palpations: Abdomen is soft. There is no mass.     Tenderness: There is no abdominal tenderness. There is no guarding.  Musculoskeletal:     Cervical back: Normal range of motion.     Comments: Patient is able to ambulate and stand, no midline spinal tenderness.  She has rigid tautness of the musculature in the mid buttock around the right piriformis region.  It is tender to palpation all along that area and along the posterior trochanter.  Range of motion is limited in the hip.  Normal strength and sensation otherwise.  Skin:    General: Skin is warm and dry.  Neurological:     Mental Status: She is alert and oriented to person, place, and time.  Psychiatric:        Behavior: Behavior normal.     (all labs ordered are listed, but  only abnormal results are displayed) Labs Reviewed - No data to display  EKG: None  Radiology: No results found.   Procedures   Medications Ordered in the ED - No data to display  Medical Decision Making Amount and/or Complexity of Data Reviewed Radiology: ordered.  Risk Prescription drug management.   Patient here with acute buttock pain in the right side. I ordered reviewed interpreted and visualized a lumbar and bilateral hip and pelvis x-ray.  Lumbar spine shows degenerative changes and an old L3 compression fracture from outpatient imaging in 2022 which I also reviewed. After review of all data points I suspect that the patient has likely piriformis syndrome or acute muscle spasm I have also considered something like a gluteal bursitis however she is able to sit on her bottom without severe pain.  Patient given a shot of Toradol and Decadron  here.  Will give Celebrex at discharge.  She has tizanidine at home.  Close follow-up with PCP.  She has an established relationship with a sports medicine physician.  I have also recommended massage therapy and given a referral to a skilled therapist.  Patient appears otherwise appropriate for discharge at this time.  Discussed return precautions.  No evidence of AVN or other acute cause of hip pain.     Final diagnoses:  None    ED Discharge Orders     None          Arloa Chroman, PA-C 08/11/24 1753    Geraldene Hamilton, MD 08/14/24 [phone]

## 2024-08-11 NOTE — Discharge Instructions (Addendum)
 ### Gluteal Pain and Back Care     **Understanding Your Diagnosis**      You have symptoms related to the deep muscles of your buttock and lower back. Two common causes are **acute deep gluteal spasm** and **piriformis syndrome**. Both can cause pain in the buttock and sometimes down the leg, but there are differences:      - **Acute deep gluteal spasm** is a sudden tightening of the deep muscles in your buttock, which can irritate the sciatic nerve and cause pain or tingling down your leg. This can be triggered by overuse, injury, or sitting for long periods.[1][2]      - **Piriformis syndrome** is a specific type of deep gluteal syndrome where the piriformis muscle (a small muscle deep in the buttock) presses on the sciatic nerve. This often causes pain in the buttock that may worsen with sitting, climbing stairs, or certain movements. Diagnosis is made after ruling out other causes, like problems in the lower back.[3][1][2][4]      **Lumbar X-ray Findings Explained**      - **Chronic lumbar vertebral body compression fracture**: This means one or more bones in your lower back have been compressed or squashed, usually from an old injury or osteoporosis. The bone may look shorter or more wedge-shaped on X-ray.      - **Multilevel degenerative disc disease**: This refers to wear-and-tear changes in several discs (the cushions between your back bones), which can cause pain and stiffness.      - **Facet hypertrophy**: The small joints in your spine (facets) have become enlarged due to arthritis or degeneration, which can also contribute to back pain.[5][6]      **Home Supportive Care**      - **Stay active as tolerated**: Gentle movement is better than bed rest. Try to keep up with daily activities as much as possible.[7][5][6]      - **Heat therapy**: Applying a warm pack to the affected area can help relax muscles and reduce pain.[7][5]      - **Over-the-counter pain relief**: Nonsteroidal  anti-inflammatory drugs (NSAIDs) like ibuprofen can help with pain, but use them as directed and discuss with your doctor if you have other health conditions.[7][5][6]      - **Massage**: Gentle massage may help relieve muscle tightness.[7][2]      **Specific Stretches for Hip and Gluteal Muscles**      - **Piriformis stretch**: Lie on your back, bend both knees. Cross the affected leg over the other knee. Gently pull the lower knee toward your chest until you feel a stretch in your buttock. Hold for 20-30 seconds, repeat 2-3 times per side.[8][9][4]      - **Optimized piriformis stretch**: For a deeper stretch, bring your hip to about 115-120 of flexion, 40-50 of external rotation, and 25-30 of adduction (moving the leg across your body). This position increases the stretch on the piriformis muscle.[8]      - **Hip flexor and hamstring stretches**: These can also help balance the muscles around your hip and lower back.[9]      - **Strengthening exercises**: Gentle core and hip strengthening can improve stability and reduce pain over time.[5][9][6]      **When to Seek Follow-Up**      - **Sports medicine or family physician**: If pain persists, worsens, or you develop new symptoms (like numbness, weakness, or loss of bladder/bowel control), follow up promptly. Your doctor can help rule out other causes and guide further treatment.[5][3][1][2]      - **Physical therapy**:  A physical therapist can teach you specific exercises and stretches tailored to your needs, monitor your progress, and help prevent future problems.[7][5][9]      - **Massage therapy**: May be helpful for muscle relaxation, but should be used alongside other treatments, not as the only therapy.[7][2]      **Summary**      Most people improve with time, gentle activity, and supportive care. Stretches and strengthening exercises for the hip and gluteal muscles are safe and effective for most people. Regular follow-up with  your healthcare team ensures the best recovery and helps prevent future problems.[7][5][3][1][8][2][9][4][6]      ### References  1. Deep Gluteal Syndrome as a Cause of Posterior Hip Pain and Sciatica-Like Pain. Viviana CHANG, Jama JUDD Jama RANDI, et al. The Bone & Joint Journal. 2020;102-B(5):556-567. doi:10.1302/[id number].[id number]-[id number].R1. 2. Deep Gluteal Pain in Orthopaedics: A Challenging Diagnosis. Gonzalez-Lomas G. The Journal of the American Academy of Orthopaedic Surgeons. 2021;29(24):e1282-e1290. doi:10.5435/JAAOS-D-21-00707. 3. Editorial Commentary: Piriformis Syndrome Is a Complex Condition That Requires Precise Diagnosis and Can Be Adequately Treated by Sciatic Neurolysis and Release of the Piriformis Tendon. Ilizaliturri VM, Wilbert Reges MH. Arthroscopy : The Journal of Arthroscopic & Related Surgery : Official Publication of the Arthroscopy Association of [place] and the International Arthroscopy Association. 2025;:S[address]49-8063(25)00559-6. doi:10.1016/j.arthro.2025.08.002. 4. Piriformis Syndrome: A Rational Approach to Management. Barton PM. Pain. 1991;47(3):345-352. doi:10.1016/0304-3959(91)90227-O. 5. Non-Invasive and Minimally Invasive Management of Low Back Disorders. Hegmann KT, Caron JONELLE Force GBJ, et al. Journal of Occupational and Environmental Medicine. 2020;62(3):e111-e138. doi:10.1097/JOM.0000000000001812. 6. Sciatica. Ropper AH, Zafonte RD. The [place] Journal of Medicine. 2015;372(13):1240-8. doi:10.1056/NEJMra1410151. 7. Noninvasive Treatments for Acute, Subacute, and Chronic Low Back Pain: A Clinical Practice Guideline From the [employer]. Qaseem A, Wilt TJ, McLean RM, et al. Annals of Internal Medicine. 2017;166(7):514-530. doi:10.7326/M16-2367. 8. Comparison of Two Stretching Methods and Optimization of Stretching Protocol for the Piriformis Muscle. Gulledge BM, Marcellin-Little HILLARY Claryce BIRCH, et al. Medical Engineering & Physics.  2014;36(2):212-8. doi:10.1016/j.medengphy.2013.10.016. 9. Core Stability and Hip Exercises Improve Physical Function and Activity in Patients With Non-Specific Low Back Pain: A Randomized Controlled Trial. Kim B, Yim J. [facility] Journal of Experimental Medicine. 2020;251(3):193-206. doi:10.1620/tjem.251.193.

## 2024-08-11 NOTE — ED Triage Notes (Signed)
 Pt c/o BL hip pain, R worse than L x 1.5 weeks since seeing a chiropractor. Denies fall.  + paresthesia.

## 2024-09-27 ENCOUNTER — Other Ambulatory Visit: Payer: Self-pay | Admitting: Family Medicine

## 2024-09-27 DIAGNOSIS — R2242 Localized swelling, mass and lump, left lower limb: Secondary | ICD-10-CM

## 2024-10-03 ENCOUNTER — Inpatient Hospital Stay
Admission: RE | Admit: 2024-10-03 | Discharge: 2024-10-03 | Disposition: A | Source: Ambulatory Visit | Attending: Family Medicine | Admitting: Family Medicine

## 2024-10-03 DIAGNOSIS — R2242 Localized swelling, mass and lump, left lower limb: Secondary | ICD-10-CM

## 2024-10-03 MED ORDER — IOPAMIDOL (ISOVUE-370) INJECTION 76%
100.0000 mL | Freq: Once | INTRAVENOUS | Status: AC | PRN
  Administered 2024-10-03: 100 mL via INTRAVENOUS
